# Patient Record
Sex: Female | Born: 1975 | Race: White | Hispanic: No | Marital: Married | Smoking: Never smoker
Health system: Southern US, Community
[De-identification: ages and names within clinical notes are randomized; demographics above are authoritative.]

## PROBLEM LIST (undated history)

## (undated) DIAGNOSIS — Z789 Other specified health status: Secondary | ICD-10-CM

## (undated) HISTORY — PX: RECTAL SURGERY: SHX760

---

## 1995-02-17 HISTORY — PX: WISDOM TOOTH EXTRACTION: SHX21

## 2013-10-10 ENCOUNTER — Encounter: Payer: Self-pay | Admitting: Certified Nurse Midwife

## 2013-10-10 ENCOUNTER — Ambulatory Visit (INDEPENDENT_AMBULATORY_CARE_PROVIDER_SITE_OTHER): Payer: Managed Care, Other (non HMO) | Admitting: Certified Nurse Midwife

## 2013-10-10 VITALS — BP 108/60 | HR 64 | Resp 16 | Ht 67.25 in | Wt 156.0 lb

## 2013-10-10 DIAGNOSIS — Z309 Encounter for contraceptive management, unspecified: Secondary | ICD-10-CM

## 2013-10-10 DIAGNOSIS — Z01419 Encounter for gynecological examination (general) (routine) without abnormal findings: Secondary | ICD-10-CM

## 2013-10-10 DIAGNOSIS — Z124 Encounter for screening for malignant neoplasm of cervix: Secondary | ICD-10-CM

## 2013-10-10 DIAGNOSIS — Z0000 Encounter for general adult medical examination without abnormal findings: Secondary | ICD-10-CM

## 2013-10-10 LAB — POCT URINALYSIS DIPSTICK
Bilirubin, UA: NEGATIVE
Blood, UA: NEGATIVE
GLUCOSE UA: NEGATIVE
Ketones, UA: NEGATIVE
Leukocytes, UA: NEGATIVE
Nitrite, UA: NEGATIVE
Protein, UA: NEGATIVE
Urobilinogen, UA: NEGATIVE
pH, UA: 5

## 2013-10-10 MED ORDER — NORGESTREL-ETHINYL ESTRADIOL 0.3-30 MG-MCG PO TABS: 1.0000 | ORAL_TABLET | Freq: Every day | ORAL | Status: DC

## 2013-10-10 NOTE — Progress Notes (Signed)
38 y.o. G0P0000 Married Caucasian Fe here to establish gyn care and for annual exam. Periods normal,no issues. OCP for contraception working well. Recently married 3 months ago. Recently moved here from [place]. Sees PCP prn. Plans to use Urgent care here. Plans to try to conceive after the first of the year. No other health concerns today.  Patient's last menstrual period was 09/12/2013.          Sexually active: Yes.    The current method of family planning is OCP (estrogen/progesterone).    Exercising: Yes.    walking Smoker:  no  Health Maintenance: Pap:  7/14  No abnormal pap smears MMG:  none Colonoscopy:  none BMD:   none TDaP:  2009 Labs: Poct urine-neg Self breast exam: not done   reports that she has never smoked. She does not have any smokeless tobacco history on file. She reports that she drinks about 2.5 ounces of alcohol per week. She reports that she does not use illicit drugs.  History reviewed. No pertinent past medical history.  Past Surgical History  Procedure Laterality Date  . Rectal surgery      as a child had repair due to injury    Current Outpatient Prescriptions  Medication Sig Dispense Refill  . CRYSELLE-28 0.3-30 MG-MCG tablet daily.       No current facility-administered medications for this visit.    Family History  Problem Relation Age of Onset  . Thyroid disease Mother   . Diabetes Father   . Heart disease Father     stints  . Emphysema Paternal Grandmother     ROS:  Pertinent items are noted in HPI.  Otherwise, a comprehensive ROS was negative.  Exam:   BP 108/60  Pulse 64  Resp 16  Ht 5' 7.25" (1.708 m)  Wt 156 lb (70.761 kg)  BMI 24.26 kg/m2  LMP 09/12/2013 Height: 5' 7.25" (170.8 cm)  Ht Readings from Last 3 Encounters:  10/10/13 5' 7.25" (1.708 m)    General appearance: alert, cooperative and appears stated age Head: Normocephalic, without obvious abnormality, atraumatic Neck: no adenopathy, supple, symmetrical,  trachea midline and thyroid normal to inspection and palpation Lungs: clear to auscultation bilaterally Breasts: normal appearance, no masses or tenderness, No nipple retraction or dimpling, No nipple discharge or bleeding, No axillary or supraclavicular adenopathy Heart: regular rate and rhythm Abdomen: soft, non-tender; no masses,  no organomegaly Extremities: extremities normal, atraumatic, no cyanosis or edema Skin: Skin color, texture, turgor normal. No rashes or lesions Lymph nodes: Cervical, supraclavicular, and axillary nodes normal. No abnormal inguinal nodes palpated Neurologic: Grossly normal   Pelvic: External genitalia:  no lesions              Urethra:  normal appearing urethra with no masses, tenderness or lesions              Bartholin's and Skene's: normal                 Vagina: normal appearing vagina with normal color and discharge, no lesions              Cervix: normal, non tender, no lesions              Pap taken: Yes.   Bimanual Exam:  Uterus:  normal size, contour, position, consistency, mobility, non-tender and anteverted              Adnexa: normal adnexa and no mass, fullness, tenderness  Rectovaginal: Confirms               Anus:  normal sphincter tone, no lesions  A:  Well Woman with normal exam  Contraception OCP desired  Preconceptual information desired  P:   Reviewed health and wellness pertinent to exam  Rx Cryselle see order  Pap smear taken today with [facility]  Given information regarding preconceptual planning and handout. Encouraged to schedule preconceptual consultation. Patient plans to.  Labs: Rubella, ABO RH   counseled on breast self exam, use and side effects of OCP's, adequate intake of calcium and vitamin D, diet and exercise  return annually or prn  An After Visit Summary was printed and given to the patient.

## 2013-10-10 NOTE — Patient Instructions (Addendum)
EXERCISE AND DIET:  We recommended that you start or continue a regular exercise program for good health. Regular exercise means any activity that makes your heart beat faster and makes you sweat.  We recommend exercising at least 30 minutes per day at least 3 days a week, preferably 4 or 5.  We also recommend a diet low in fat and sugar.  Inactivity, poor dietary choices and obesity can cause diabetes, heart attack, stroke, and kidney damage, among others.    ALCOHOL AND SMOKING:  Women should limit their alcohol intake to no more than 7 drinks/beers/glasses of wine (combined, not each!) per week. Moderation of alcohol intake to this level decreases your risk of breast cancer and liver damage. And of course, no recreational drugs are part of a healthy lifestyle.  And absolutely no smoking or even second hand smoke. Most people know smoking can cause heart and lung diseases, but did you know it also contributes to weakening of your bones? Aging of your skin?  Yellowing of your teeth and nails?  CALCIUM AND VITAMIN D:  Adequate intake of calcium and Vitamin D are recommended.  The recommendations for exact amounts of these supplements seem to change often, but generally speaking 600 mg of calcium (either carbonate or citrate) and 800 units of Vitamin D per day seems prudent. Certain women may benefit from higher intake of Vitamin D.  If you are among these women, your doctor will have told you during your visit.    PAP SMEARS:  Pap smears, to check for cervical cancer or precancers,  have traditionally been done yearly, although recent scientific advances have shown that most women can have pap smears less often.  However, every woman still should have a physical exam from her gynecologist every year. It will include a breast check, inspection of the vulva and vagina to check for abnormal growths or skin changes, a visual exam of the cervix, and then an exam to evaluate the size and shape of the uterus and  ovaries.  And after 40 years of age, a rectal exam is indicated to check for rectal cancers. We will also provide age appropriate advice regarding health maintenance, like when you should have certain vaccines, screening for sexually transmitted diseases, bone density testing, colonoscopy, mammograms, etc.   MAMMOGRAMS:  All women over 40 years old should have a yearly mammogram. Many facilities now offer a "3D" mammogram, which may cost around $50 extra out of pocket. If possible,  we recommend you accept the option to have the 3D mammogram performed.  It both reduces the number of women who will be called back for extra views which then turn out to be normal, and it is better than the routine mammogram at detecting truly abnormal areas.    COLONOSCOPY:  Colonoscopy to screen for colon cancer is recommended for all women at age 50.  We know, you hate the idea of the prep.  We agree, BUT, having colon cancer and not knowing it is worse!!  Colon cancer so often starts as a polyp that can be seen and removed at colonscopy, which can quite literally save your life!  And if your first colonoscopy is normal and you have no family history of colon cancer, most women don't have to have it again for 10 years.  Once every ten years, you can do something that may end up saving your life, right?  We will be happy to help you get it scheduled when you are ready.    Be sure to check your insurance coverage so you understand how much it will cost.  It may be covered as a preventative service at no cost, but you should check your particular policy.     Preparing for Pregnancy Before trying to become pregnant, make an appointment with your health care provider (preconception care). The goal is to help you have a healthy, safe pregnancy. At your first appointment, your health care provider will:   Do a complete physical exam, including a Pap test.  Take a complete medical history.  Give you advice and help you resolve  any problems. PRECONCEPTION CHECKLIST Here is a list of the basics to cover with your health care provider at your preconception visit:  Medical history.  Tell your health care provider about any diseases you have had. Many diseases can affect your pregnancy.  Include your partner's medical history and family history.  Make sure you have been tested for sexually transmitted infections (STIs). These can affect your pregnancy. In some cases, they can be passed to your baby. Tell your health care provider about any history of STIs.  Make sure your health care provider knows about any previous problems you have had with conception or pregnancy.  Tell your health care provider about any medicine you take. This includes herbal supplements and over-the-counter medicines.  Make sure all your immunizations are up to date. You may need to make additional appointments.  Ask your health care provider if you need any vaccinations or if there are any you should avoid.  Diet.  It is especially important to eat a healthy, balanced diet with the right nutrients when you are pregnant.  Ask your health care provider to help you get to a healthy weight before pregnancy.  If you are overweight, you are at higher risk for certain complications. These include high blood pressure, diabetes, and preterm birth.  If you are underweight, you are more likely to have a low-birth-weight baby.  Lifestyle.  Tell your health care provider about lifestyle factors such as alcohol use, drug use, or smoking.  Describe any harmful substances you may be exposed to at work or home. These can include chemicals, pesticides, and radiation.  Mental health.  Let your health care provider know if you have been feeling depressed or anxious.  Let your health care provider know if you have a history of substance abuse.  Let your health care provider know if you do not feel safe at home. HOME INSTRUCTIONS TO PREPARE FOR  PREGNANCY Follow your health care provider's advice and instructions.   Keep an accurate record of your menstrual periods. This makes it easier for your health care provider to determine your baby's due date.  Begin taking prenatal vitamins and folic acid supplements daily. Take them as directed by your health care provider.  Eat a balanced diet. Get help from a nutrition counselor if you have questions or need help.  Get regular exercise. Try to be active for at least 30 minutes a day most days of the week.  Quit smoking, if you smoke.  Do not drink alcohol.  Do not take illegal drugs.  Get medical problems, such as diabetes or high blood pressure, under control.  If you have diabetes, make sure you do the following:  Have good blood sugar control. If you have type 1 diabetes, use multiple daily doses of insulin. Do not use split-dose or premixed insulin.  Have an eye exam by a qualified eye care professional trained  in caring for people with diabetes.  Get evaluated by your health care provider for cardiovascular disease.  Get to a healthy weight. If you are overweight or obese, reduce your weight with the help of a qualified health professional such as a Museum/gallery exhibitions officer. Ask your health care provider what the right weight range is for you. HOW DO I KNOW I AM PREGNANT? You may be pregnant if you have been sexually active and you miss your period. Symptoms of early pregnancy include:   Mild cramping.  Very light vaginal bleeding (spotting).  Feeling unusually tired.  Morning sickness. If you have any of these symptoms, take a home pregnancy test. These tests look for a hormone called human chorionic gonadotropin (hCG) in your urine. Your body begins to make this hormone during early pregnancy. These tests are very accurate. Wait until at least the first day you miss your period to take one. If you get a positive result, call your health care provider to make appointments  for prenatal care. WHAT SHOULD I DO IF I BECOME PREGNANT?  Make an appointment with your health care provider by week 12 of your pregnancy at the latest.  Do not smoke. Smoking can be harmful to your baby.  Do not drink alcoholic beverages. Alcohol is related to a number of birth defects.  Avoid toxic odors and chemicals.  You may continue to have sexual intercourse if it does not cause pain or other problems, such as vaginal bleeding. Document Released: 01/16/2008 Document Revised: 06/19/2013 Document Reviewed: 01/09/2013 [facility] Patient Information 2015 [place], [place]. This information is not intended to replace advice given to you by your health care provider. Make sure you discuss any questions you have with your health care provider.

## 2013-10-11 LAB — ABO AND RH: Rh Type: POSITIVE

## 2013-10-11 LAB — RUBELLA SCREEN: RUBELLA: 1.74 {index} — AB (ref <0.90)

## 2013-10-12 LAB — IPS PAP TEST WITH HPV

## 2013-10-13 NOTE — Progress Notes (Signed)
Reviewed personally.  M. Suzanne Terilyn Sano, MD.  

## 2014-03-05 ENCOUNTER — Telehealth: Payer: Self-pay | Admitting: Certified Nurse Midwife

## 2014-03-05 NOTE — Telephone Encounter (Signed)
Spoke with patient. Patient states that her LMP was 12/11. Patient took 3 UPT on 1/15 that were all positive. Advised patient will need OV with Verner Choleborah S. Leonard CNM to confirm pregnancy and to discuss pregnancy. Patient is agreeable. Appointment scheduled for tomorrow at 10:30am. Agreeable to date and time. 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 Is this something that I will need to stop? Can I go tonight?" Advised patient can still go to crossfit class tonight. Cautioned not to perform any activity that would cause harm to herself or baby. Advised to speak with Verner Choleborah S. Leonard CNM tomorrow about how to modify work outs and when she will need to stop if needed. Patient is agreeable.  Routing to provider for final review. Patient agreeable to disposition. Will close encounter

## 2014-03-05 NOTE — Telephone Encounter (Signed)
Left message to call Corryn Madewell at [phone]. 

## 2014-03-05 NOTE — Telephone Encounter (Signed)
Patient took a pregnancy test it was positive she would like to talk with the nurse to discuss what she needs to do next.

## 2014-03-06 ENCOUNTER — Ambulatory Visit (INDEPENDENT_AMBULATORY_CARE_PROVIDER_SITE_OTHER): Payer: Managed Care, Other (non HMO) | Admitting: Certified Nurse Midwife

## 2014-03-06 ENCOUNTER — Encounter: Payer: Self-pay | Admitting: Certified Nurse Midwife

## 2014-03-06 VITALS — BP 104/66 | HR 72 | Resp 14 | Ht 67.25 in | Wt 163.8 lb

## 2014-03-06 DIAGNOSIS — Z3201 Encounter for pregnancy test, result positive: Secondary | ICD-10-CM

## 2014-03-06 DIAGNOSIS — N912 Amenorrhea, unspecified: Secondary | ICD-10-CM

## 2014-03-06 LAB — POCT URINE PREGNANCY: Preg Test, Ur: NEGATIVE

## 2014-03-06 NOTE — Patient Instructions (Signed)
Prenatal Care  WHAT IS PRENATAL CARE?  Prenatal care means health care during your pregnancy, before your baby is born. It is very important to take care of yourself and your baby during your pregnancy by:   Getting early prenatal care. If you know you are pregnant, or think you might be pregnant, call your health care provider as soon as possible. Schedule a visit for a prenatal exam.  Getting regular prenatal care. Follow your health care provider's schedule for blood and other necessary tests. Do not miss appointments.  Doing everything you can to keep yourself and your baby healthy during your pregnancy.  Getting complete care. Prenatal care should include evaluation of the medical, dietary, educational, psychological, and social needs of you and your significant other. The medical and genetic history of your family and the family of your baby's father should be discussed with your health care provider.  Discussing with your health care provider:  Prescription, over-the-counter, and herbal medicines that you take.  Any history of substance abuse, alcohol use, smoking, and illegal drug use.  Any history of domestic abuse and violence.  Immunizations you have received.  Your nutrition and diet.  The amount of exercise you do.  Any environmental and occupational hazards to which you are exposed.  History of sexually transmitted infections for both you and your partner.  Previous pregnancies you have had. WHY IS PRENATAL CARE SO IMPORTANT?  By regularly seeing your health care provider, you help ensure that problems can be identified early so that they can be treated as soon as possible. Other problems might be prevented. Many studies have shown that early and regular prenatal care is important for the health of mothers and their babies.  HOW CAN I TAKE CARE OF MYSELF WHILE I AM PREGNANT?  Here are ways to take care of yourself and your baby:   Start or continue taking your  multivitamin with 400 micrograms (mcg) of folic acid every day.  Get early and regular prenatal care. It is very important to see a health care provider during your pregnancy. Your health care provider will check at each visit to make sure that you and your baby are healthy. If there are any problems, action can be taken right away to help you and your baby.  Eat a healthy diet that includes:  Fruits.  Vegetables.  Foods low in saturated fat.  Whole grains.  Calcium-rich foods, such as milk, yogurt, and hard cheeses.  Drink 6-8 glasses of liquids a day.  Unless your health care provider tells you not to, try to be physically active for 30 minutes, most days of the week. If you are pressed for time, you can get your activity in through 10-minute segments, three times a day.  Do not smoke, drink alcohol, or use drugs. These can cause long-term damage to your baby. Talk with your health care provider about steps to take to stop smoking. Talk with a member of your faith community, a counselor, a trusted friend, or your health care provider if you are concerned about your alcohol or drug use.  Ask your health care provider before taking any medicine, even over-the-counter medicines. Some medicines are not safe to take during pregnancy.  Get plenty of rest and sleep.  Avoid hot tubs and saunas during pregnancy.  Do not have X-rays taken unless absolutely necessary and with the recommendation of your health care provider. A lead shield can be placed on your abdomen to protect your baby when   X-rays are taken in other parts of your body.  Do not empty the cat litter when you are pregnant. It may contain a parasite that causes an infection called toxoplasmosis, which can cause birth defects. Also, use gloves when working in garden areas used by cats.  Do not eat uncooked or undercooked meats or fish.  Do not eat soft, mold-ripened cheeses (Brie, Camembert, and chevre) or soft, blue-veined  cheese (Danish blue and Roquefort).  Stay away from toxic chemicals like:  Insecticides.  Solvents (some cleaners or paint thinners).  Lead.  Mercury.  Sexual intercourse may continue until the end of the pregnancy, unless you have a medical problem or there is a problem with the pregnancy and your health care provider tells you not to.  Do not wear high-heel shoes, especially during the second half of the pregnancy. You can lose your balance and fall.  Do not take long trips, unless absolutely necessary. Be sure to see your health care provider before going on the trip.  Do not sit in one position for more than 2 hours when on a trip.  Take a copy of your medical records when going on a trip. Know where a hospital is located in the city you are visiting, in case of an emergency.  Most dangerous household products will have pregnancy warnings on their labels. Ask your health care provider about products if you are unsure.  Limit or eliminate your caffeine intake from coffee, tea, sodas, medicines, and chocolate.  Many women continue working through pregnancy. Staying active might help you stay healthier. If you have a question about the safety or the hours you work at your particular job, talk with your health care provider.  Get informed:  Read books.  Watch videos.  Go to childbirth classes for you and your significant other.  Talk with experienced moms.  Ask your health care provider about childbirth education classes for you and your partner. Classes can help you and your partner prepare for the birth of your baby.  Ask about a baby doctor (pediatrician) and methods and pain medicine for labor, delivery, and possible cesarean delivery. HOW OFTEN SHOULD I SEE MY HEALTH CARE PROVIDER DURING PREGNANCY?  Your health care provider will give you a schedule for your prenatal visits. You will have visits more often as you get closer to the end of your pregnancy. An average  pregnancy lasts about 40 weeks.  A typical schedule includes visiting your health care provider:   About once each month during your first 6 months of pregnancy.  Every 2 weeks during the next 2 months.  Weekly in the last month, until the delivery date. Your health care provider will probably want to see you more often if:  You are older than 35 years.  Your pregnancy is high risk because you have certain health problems or problems with the pregnancy, such as:  Diabetes.  High blood pressure.  The baby is not growing on schedule, according to the dates of the pregnancy. Your health care provider will do special tests to make sure you and your baby are not having any serious problems. WHAT HAPPENS DURING PRENATAL VISITS?   At your first prenatal visit, your health care provider will do a physical exam and talk to you about your health history and the health history of your partner and your family. Your health care provider will be able to tell you what date to expect your baby to be born on.  Your   first physical exam will include checks of your blood pressure, measurements of your height and weight, and an exam of your pelvic organs. Your health care provider will do a Pap test if you have not had one recently and will do cultures of your cervix to make sure there is no infection.  At each prenatal visit, there will be tests of your blood, urine, blood pressure, weight, and the progress of the baby will be checked.  At your later prenatal visits, your health care provider will check how you are doing and how your baby is developing. You may have a number of tests done as your pregnancy progresses.  Ultrasound exams are often used to check on your baby's growth and health.  You may have more urine and blood tests, as well as special tests, if needed. These may include amniocentesis to examine fluid in the pregnancy sac, stress tests to check how the baby responds to contractions, or a  biophysical profile to measure your baby's well-being. Your health care provider will explain the tests and why they are necessary.  You should be tested for high blood sugar (gestational diabetes) between the 24th and 28th weeks of your pregnancy.  You should discuss with your health care provider your plans to breastfeed or bottle-feed your baby.  Each visit is also a chance for you to learn about staying healthy during pregnancy and to ask questions. Document Released: 02/05/2003 Document Revised: 02/07/2013 Document Reviewed: 04/19/2013 [employer] Patient Information 2015 [employer]. This information is not intended to replace advice given to you by your health care provider. Make sure you discuss any questions you have with your health care provider.  

## 2014-03-06 NOTE — Progress Notes (Signed)
Reviewed personally.  M. Suzanne Marchetta Navratil, MD.  

## 2014-03-06 NOTE — Progress Notes (Signed)
39 y.o. Married Caucasian G1P0000 here with complaint of amenorrhea with positive UPT 02/23/13. LNMP 01-26-14. Periods have been normal. Stopped OCP in 10/15 and used withdrawal for contraception until December and then stopped. Patient had two home positive UPT on  02/23/14. Patient has slight nausea with slight breast tenderness. Eating well and drinking water daily with limited coffee and soda. Normally does cross fit but feels this is two vigorous for her now and plans to decrease to treadmill and light had weights. Spouse excited also. Patient denies any bleeding spotting or cramping. She has started on OTC prenatal vitamins without problems. She lives in [place] and will probably obtain OB care there. [facility]. No other medication use, no alcohol or smoking.   O: Healthy female, WD WN Affect: normal orientation X 3    A:Amenorrhea with positive UPT here 5 wk 5 d per LNMP with EDC 11-03-14.  P: Discussed with patient nutritional importance in pregnancy. Discussed food selections and foods she should avoid or limit during pregnancy. Discussed no alcohol recommended in pregnancy. TDAP up to date and patient will have spouse up date his if needed. Discussed normal physiological changes during early pregnancy. Questions addressed. Discussed options for prenatal care and importance of scheduling appointment by 8-9 weeks. Patient plans to Internet review of providers in [place] and decide. Offered viability PUS here. Patient declines at this time, will advise after discussion with spouse if she desires to have here.Will advise when OB appointment scheduled. Reviewed warning signs of early pregnancy and did to call if occurs. Discussed comfort measures for nausea and bowel changes when occurring. Encouraged small frequent meals to help with nausea. Should advise if vomiting occurs frequently. Patient give information pamphlet on early pregnancy expectations and genetic  screening information. Questions addressed. Wished well with pregnancy.  30 minutes spent with patient with  in face to face counseling regarding pregnancy, prenatal care and expectations.Marland Kitchen.  RV

## 2014-04-03 LAB — OB RESULTS CONSOLE HEPATITIS B SURFACE ANTIGEN: Hepatitis B Surface Ag: NEGATIVE

## 2014-04-03 LAB — OB RESULTS CONSOLE HIV ANTIBODY (ROUTINE TESTING): HIV: NONREACTIVE

## 2014-04-03 LAB — OB RESULTS CONSOLE RUBELLA ANTIBODY, IGM: Rubella: IMMUNE

## 2014-04-03 LAB — OB RESULTS CONSOLE ABO/RH: RH Type: POSITIVE

## 2014-04-03 LAB — OB RESULTS CONSOLE ANTIBODY SCREEN: Antibody Screen: NEGATIVE

## 2014-04-03 LAB — OB RESULTS CONSOLE RPR: RPR: NONREACTIVE

## 2014-04-12 LAB — OB RESULTS CONSOLE GC/CHLAMYDIA
Chlamydia: NEGATIVE
GC PROBE AMP, GENITAL: NEGATIVE

## 2014-10-16 LAB — OB RESULTS CONSOLE GBS: GBS: POSITIVE

## 2014-10-23 ENCOUNTER — Ambulatory Visit: Payer: Managed Care, Other (non HMO) | Admitting: Certified Nurse Midwife

## 2014-10-28 ENCOUNTER — Inpatient Hospital Stay (HOSPITAL_COMMUNITY)
Admission: AD | Admit: 2014-10-28 | Discharge: 2014-10-28 | Disposition: A | Discharge status: Home / Self Care | Payer: Managed Care, Other (non HMO) | Source: Ambulatory Visit | Attending: Obstetrics and Gynecology | Admitting: Obstetrics and Gynecology

## 2014-10-28 ENCOUNTER — Encounter (HOSPITAL_COMMUNITY): Payer: Self-pay | Admitting: *Deleted

## 2014-10-28 DIAGNOSIS — Z3493 Encounter for supervision of normal pregnancy, unspecified, third trimester: Secondary | ICD-10-CM | POA: Insufficient documentation

## 2014-10-28 LAB — AMNISURE RUPTURE OF MEMBRANE (ROM) NOT AT ARMC: AMNISURE: NEGATIVE

## 2014-10-28 NOTE — Discharge Instructions (Signed)
Braxton Hicks Contractions °Contractions of the uterus can occur throughout pregnancy. Contractions are not always a sign that you are in labor.  °WHAT ARE BRAXTON HICKS CONTRACTIONS?  °Contractions that occur before labor are called Braxton Hicks contractions, or false labor. Toward the end of pregnancy (32-34 weeks), these contractions can develop more often and may become more forceful. This is not true labor because these contractions do not result in opening (dilatation) and thinning of the cervix. They are sometimes difficult to tell apart from true labor because these contractions can be forceful and people have different pain tolerances. You should not feel embarrassed if you go to the hospital with false labor. Sometimes, the only way to tell if you are in true labor is for your health care provider to look for changes in the cervix. °If there are no prenatal problems or other health problems associated with the pregnancy, it is completely safe to be sent home with false labor and await the onset of true labor. °HOW CAN YOU TELL THE DIFFERENCE BETWEEN TRUE AND FALSE LABOR? °False Labor °· The contractions of false labor are usually shorter and not as hard as those of true labor.   °· The contractions are usually irregular.   °· The contractions are often felt in the front of the lower abdomen and in the groin.   °· The contractions may go away when you walk around or change positions while lying down.   °· The contractions get weaker and are shorter lasting as time goes on.   °· The contractions do not usually become progressively stronger, regular, and closer together as with true labor.   °True Labor °· Contractions in true labor last 30-70 seconds, become very regular, usually become more intense, and increase in frequency.   °· The contractions do not go away with walking.   °· The discomfort is usually felt in the top of the uterus and spreads to the lower abdomen and low back.   °· True labor can be  determined by your health care provider with an exam. This will show that the cervix is dilating and getting thinner.   °WHAT TO REMEMBER °· Keep up with your usual exercises and follow other instructions given by your health care provider.   °· Take medicines as directed by your health care provider.   °· Keep your regular prenatal appointments.   °· Eat and drink lightly if you think you are going into labor.   °· If Braxton Hicks contractions are making you uncomfortable:   °¨ Change your position from lying down or resting to walking, or from walking to resting.   °¨ Sit and rest in a tub of warm water.   °¨ Drink 2-3 glasses of water. Dehydration may cause these contractions.   °¨ Do slow and deep breathing several times an hour.   °WHEN SHOULD I SEEK IMMEDIATE MEDICAL CARE? °Seek immediate medical care if: °· Your contractions become stronger, more regular, and closer together.   °· You have fluid leaking or gushing from your vagina.   °· You have a fever.   °· You pass blood-tinged mucus.   °· You have vaginal bleeding.   °· You have continuous abdominal pain.   °· You have low back pain that you never had before.   °· You feel your baby's head pushing down and causing pelvic pressure.   °· Your baby is not moving as much as it used to.   °Document Released: 02/02/2005 Document Revised: 02/07/2013 Document Reviewed: 11/14/2012 °Exit[employer]® Patient Information ©2015 [employer]. This information is not intended to replace advice given to you by your health care   provider. Make sure you discuss any questions you have with your health care provider. ° °

## 2014-10-28 NOTE — MAU Note (Signed)
Gush of fluid while brushing her teeth, small amt since. No bleeding.  No pains, some pelvic pressure.  No problems with preg.

## 2014-10-28 NOTE — MAU Note (Signed)
Per HMitchell, RN charge, pt to go to room 169.  

## 2014-11-07 ENCOUNTER — Encounter (HOSPITAL_COMMUNITY): Payer: Self-pay

## 2014-11-07 ENCOUNTER — Inpatient Hospital Stay (HOSPITAL_COMMUNITY): Payer: Managed Care, Other (non HMO) | Admitting: Anesthesiology

## 2014-11-07 ENCOUNTER — Inpatient Hospital Stay (HOSPITAL_COMMUNITY)
Admission: AD | Admit: 2014-11-07 | Discharge: 2014-11-10 | DRG: 775 | Disposition: A | Discharge status: Home / Self Care | Payer: Managed Care, Other (non HMO) | Source: Ambulatory Visit | Attending: Obstetrics and Gynecology | Admitting: Obstetrics and Gynecology

## 2014-11-07 DIAGNOSIS — Z833 Family history of diabetes mellitus: Secondary | ICD-10-CM | POA: Diagnosis not present

## 2014-11-07 DIAGNOSIS — Z825 Family history of asthma and other chronic lower respiratory diseases: Secondary | ICD-10-CM | POA: Diagnosis not present

## 2014-11-07 DIAGNOSIS — Z3A39 39 weeks gestation of pregnancy: Secondary | ICD-10-CM | POA: Diagnosis present

## 2014-11-07 DIAGNOSIS — O99824 Streptococcus B carrier state complicating childbirth: Secondary | ICD-10-CM | POA: Diagnosis present

## 2014-11-07 DIAGNOSIS — Z8249 Family history of ischemic heart disease and other diseases of the circulatory system: Secondary | ICD-10-CM | POA: Diagnosis not present

## 2014-11-07 HISTORY — DX: Other specified health status: Z78.9

## 2014-11-07 LAB — CBC
HCT: 37.4 % (ref 36.0–46.0)
HEMOGLOBIN: 13.3 g/dL (ref 12.0–15.0)
MCH: 32.6 pg (ref 26.0–34.0)
MCHC: 35.6 g/dL (ref 30.0–36.0)
MCV: 91.7 fL (ref 78.0–100.0)
Platelets: 188 10*3/uL (ref 150–400)
RBC: 4.08 MIL/uL (ref 3.87–5.11)
RDW: 12.8 % (ref 11.5–15.5)
WBC: 10.6 10*3/uL — ABNORMAL HIGH (ref 4.0–10.5)

## 2014-11-07 LAB — TYPE AND SCREEN
ABO/RH(D): A POS
ANTIBODY SCREEN: NEGATIVE

## 2014-11-07 MED ORDER — ACETAMINOPHEN 325 MG PO TABS: 650.0000 mg | ORAL_TABLET | ORAL | Status: DC | PRN

## 2014-11-07 MED ORDER — LACTATED RINGERS IV SOLN
INTRAVENOUS | Status: DC
  Administered 2014-11-07 (×2): via INTRAVENOUS

## 2014-11-07 MED ORDER — ONDANSETRON HCL 4 MG/2ML IJ SOLN
4.0000 mg | Freq: Four times a day (QID) | INTRAMUSCULAR | Status: DC | PRN
  Administered 2014-11-07: 4 mg via INTRAVENOUS
  Filled 2014-11-07: qty 2

## 2014-11-07 MED ORDER — LIDOCAINE HCL (PF) 1 % IJ SOLN
INTRAMUSCULAR | Status: DC | PRN
  Administered 2014-11-07 (×2): 5 mL
  Administered 2014-11-07: 3 mL

## 2014-11-07 MED ORDER — LIDOCAINE HCL (PF) 1 % IJ SOLN
30.0000 mL | INTRAMUSCULAR | Status: DC | PRN
  Filled 2014-11-07: qty 30

## 2014-11-07 MED ORDER — DIPHENHYDRAMINE HCL 50 MG/ML IJ SOLN: 12.5000 mg | INTRAMUSCULAR | Status: DC | PRN

## 2014-11-07 MED ORDER — PENICILLIN G POTASSIUM 5000000 UNITS IJ SOLR
2.5000 10*6.[IU] | INTRAVENOUS | Status: DC
  Administered 2014-11-08: 2.5 10*6.[IU] via INTRAVENOUS
  Filled 2014-11-07 (×5): qty 2.5

## 2014-11-07 MED ORDER — FENTANYL 2.5 MCG/ML BUPIVACAINE 1/10 % EPIDURAL INFUSION (WH - ANES)
14.0000 mL/h | INTRAMUSCULAR | Status: DC | PRN
  Administered 2014-11-07: 14 mL/h via EPIDURAL
  Filled 2014-11-07: qty 125

## 2014-11-07 MED ORDER — LACTATED RINGERS IV SOLN
500.0000 mL | INTRAVENOUS | Status: DC | PRN
  Administered 2014-11-07 (×2): 500 mL via INTRAVENOUS

## 2014-11-07 MED ORDER — OXYTOCIN 40 UNITS IN LACTATED RINGERS INFUSION - SIMPLE MED
62.5000 mL/h | INTRAVENOUS | Status: DC
  Administered 2014-11-08: 62.5 mL/h via INTRAVENOUS
  Filled 2014-11-07: qty 1000

## 2014-11-07 MED ORDER — PHENYLEPHRINE 40 MCG/ML (10ML) SYRINGE FOR IV PUSH (FOR BLOOD PRESSURE SUPPORT)
80.0000 ug | PREFILLED_SYRINGE | INTRAVENOUS | Status: DC | PRN
  Administered 2014-11-07: 80 ug via INTRAVENOUS
  Filled 2014-11-07: qty 20
  Filled 2014-11-07: qty 2

## 2014-11-07 MED ORDER — PENICILLIN G POTASSIUM 5000000 UNITS IJ SOLR
5.0000 10*6.[IU] | Freq: Once | INTRAVENOUS | Status: AC
  Administered 2014-11-07: 5 10*6.[IU] via INTRAVENOUS
  Filled 2014-11-07: qty 5

## 2014-11-07 MED ORDER — OXYTOCIN BOLUS FROM INFUSION
500.0000 mL | INTRAVENOUS | Status: DC
Start: 2014-11-07 — End: 2014-11-08
  Administered 2014-11-08: 500 mL via INTRAVENOUS

## 2014-11-07 MED ORDER — CITRIC ACID-SODIUM CITRATE 334-500 MG/5ML PO SOLN: 30.0000 mL | ORAL | Status: DC | PRN

## 2014-11-07 MED ORDER — OXYCODONE-ACETAMINOPHEN 5-325 MG PO TABS: 1.0000 | ORAL_TABLET | ORAL | Status: DC | PRN

## 2014-11-07 MED ORDER — OXYCODONE-ACETAMINOPHEN 5-325 MG PO TABS: 2.0000 | ORAL_TABLET | ORAL | Status: DC | PRN

## 2014-11-07 MED ORDER — EPHEDRINE 5 MG/ML INJ
10.0000 mg | INTRAVENOUS | Status: DC | PRN
  Filled 2014-11-07: qty 2

## 2014-11-07 NOTE — Anesthesia Procedure Notes (Signed)
Epidural Patient location during procedure: OB  Staffing Anesthesiologist: Vetra Shinall Performed by: anesthesiologist   Preanesthetic Checklist Completed: patient identified, site marked, surgical consent, pre-op evaluation, timeout performed, IV checked, risks and benefits discussed and monitors and equipment checked  Epidural Patient position: sitting Prep: DuraPrep Patient monitoring: heart rate, continuous pulse ox and blood pressure Approach: right paramedian Location: L3-L4 Injection technique: LOR saline  Needle:  Needle type: Tuohy  Needle gauge: 17 G Needle length: 9 cm and 9 Needle insertion depth: 5 cm Catheter type: closed end flexible Catheter size: 20 Guage Catheter at skin depth: 10 cm Test dose: negative  Assessment Events: blood not aspirated, injection not painful, no injection resistance, negative IV test and no paresthesia  Additional Notes Patient identified. Risks/Benefits/Options discussed with patient including but not limited to bleeding, infection, nerve damage, paralysis, failed block, incomplete pain control, headache, blood pressure changes, nausea, vomiting, reactions to medication both or allergic, itching and postpartum back pain. Confirmed with bedside nurse the patient's most recent platelet count. Confirmed with patient that they are not currently taking any anticoagulation, have any bleeding history or any family history of bleeding disorders. Patient expressed understanding and wished to proceed. All questions were answered. Sterile technique was used throughout the entire procedure. Please see nursing notes for vital signs. Test dose was given through epidural needle and negative prior to continuing to dose epidural or start infusion. Warning signs of high block given to the patient including shortness of breath, tingling/numbness in hands, complete motor block, or any concerning symptoms with instructions to call for help. Patient was given  instructions on fall risk and not to get out of bed. All questions and concerns addressed with instructions to call with any issues.   

## 2014-11-07 NOTE — H&P (Signed)
Patricia Ortega is a 39 y.o. female G1 @ 39 wks presenting for labor.  Pregnancy uncomplicated. History OB History    Gravida Para Term Preterm AB TAB SAB Ectopic Multiple Living       History reviewed. No pertinent past medical history. Past Surgical History  Procedure Laterality Date  . Rectal surgery      as a child had repair due to injury  . Wisdom tooth extraction Bilateral 1997   Family History: family history includes Diabetes in her father; Emphysema in her paternal grandmother; Heart disease in her father; Thyroid disease in her mother. Social History:  reports that she has never smoked. She has never used smokeless tobacco. She reports that she drinks about 2.5 oz of alcohol per week. She reports that she does not use illicit drugs.   Prenatal Transfer Tool  Maternal Diabetes: No Genetic Screening: Declined Maternal Ultrasounds/Referrals: Normal Fetal Ultrasounds or other Referrals:  None Maternal Substance Abuse:  No Significant Maternal Medications:  None Significant Maternal Lab Results:  None Other Comments:  None  ROS  Dilation: 3 Effacement (%): 90 Station: -3 Exam by:: D Simpson RN Blood pressure 126/77, pulse 77, temperature 98 F (36.7 C), temperature source Oral, resp. rate 18, last menstrual period 01/26/2014, SpO2 99 %. Exam Physical Exam  Gen - uncomfortable w/ epidural Abd - gravid, NT Cvx 3cm per rn exam - 1cm in office  Prenatal labs: ABO, Rh: A/Positive/-- (02/16 0000) Antibody: Negative (02/16 0000) Rubella: Immune (02/16 0000) RPR: Nonreactive (02/16 0000)  HBsAg: Negative (02/16 0000)  HIV: Non-reactive (02/16 0000)  GBS: Positive (08/30 0000)   Assessment/Plan: Labor Admit PCN epidural   ADKINS,GRETCHEN 11/07/2014, 8:15 PM

## 2014-11-07 NOTE — MAU Note (Signed)
Contractions since 5pm-irregular. Denies LOF. Some bloody show. +FM. +GBS 1cm last week

## 2014-11-07 NOTE — Anesthesia Preprocedure Evaluation (Signed)

## 2014-11-08 ENCOUNTER — Encounter (HOSPITAL_COMMUNITY): Payer: Self-pay

## 2014-11-08 LAB — RPR: RPR: NONREACTIVE

## 2014-11-08 LAB — ABO/RH: ABO/RH(D): A POS

## 2014-11-08 MED ORDER — DIBUCAINE 1 % RE OINT: 1.0000 "application " | TOPICAL_OINTMENT | RECTAL | Status: DC | PRN

## 2014-11-08 MED ORDER — TETANUS-DIPHTH-ACELL PERTUSSIS 5-2.5-18.5 LF-MCG/0.5 IM SUSP
0.5000 mL | Freq: Once | INTRAMUSCULAR | Status: DC
Start: 2014-11-09 — End: 2014-11-10

## 2014-11-08 MED ORDER — ONDANSETRON HCL 4 MG PO TABS: 4.0000 mg | ORAL_TABLET | ORAL | Status: DC | PRN

## 2014-11-08 MED ORDER — MEDROXYPROGESTERONE ACETATE 150 MG/ML IM SUSP: 150.0000 mg | INTRAMUSCULAR | Status: DC | PRN

## 2014-11-08 MED ORDER — INFLUENZA VAC SPLIT QUAD 0.5 ML IM SUSY
0.5000 mL | PREFILLED_SYRINGE | INTRAMUSCULAR | Status: AC
  Administered 2014-11-09: 0.5 mL via INTRAMUSCULAR
  Filled 2014-11-08: qty 0.5

## 2014-11-08 MED ORDER — OXYCODONE-ACETAMINOPHEN 5-325 MG PO TABS
1.0000 | ORAL_TABLET | ORAL | Status: DC | PRN
  Administered 2014-11-09: 1 via ORAL
  Filled 2014-11-08: qty 1

## 2014-11-08 MED ORDER — IBUPROFEN 600 MG PO TABS
600.0000 mg | ORAL_TABLET | Freq: Four times a day (QID) | ORAL | Status: DC
  Administered 2014-11-08 – 2014-11-10 (×10): 600 mg via ORAL
  Filled 2014-11-08 (×10): qty 1

## 2014-11-08 MED ORDER — ACETAMINOPHEN 325 MG PO TABS: 650.0000 mg | ORAL_TABLET | ORAL | Status: DC | PRN

## 2014-11-08 MED ORDER — SIMETHICONE 80 MG PO CHEW: 80.0000 mg | CHEWABLE_TABLET | ORAL | Status: DC | PRN

## 2014-11-08 MED ORDER — LANOLIN HYDROUS EX OINT: TOPICAL_OINTMENT | CUTANEOUS | Status: DC | PRN

## 2014-11-08 MED ORDER — OXYCODONE-ACETAMINOPHEN 5-325 MG PO TABS: 2.0000 | ORAL_TABLET | ORAL | Status: DC | PRN

## 2014-11-08 MED ORDER — BENZOCAINE-MENTHOL 20-0.5 % EX AERO
1.0000 "application " | INHALATION_SPRAY | CUTANEOUS | Status: DC | PRN
  Administered 2014-11-08: 1 via TOPICAL
  Filled 2014-11-08: qty 56

## 2014-11-08 MED ORDER — ZOLPIDEM TARTRATE 5 MG PO TABS: 5.0000 mg | ORAL_TABLET | Freq: Every evening | ORAL | Status: DC | PRN

## 2014-11-08 MED ORDER — WITCH HAZEL-GLYCERIN EX PADS: 1.0000 "application " | MEDICATED_PAD | CUTANEOUS | Status: DC | PRN

## 2014-11-08 MED ORDER — PRENATAL MULTIVITAMIN CH
1.0000 | ORAL_TABLET | Freq: Every day | ORAL | Status: DC
  Administered 2014-11-08 – 2014-11-10 (×3): 1 via ORAL
  Filled 2014-11-08 (×3): qty 1

## 2014-11-08 MED ORDER — MEASLES, MUMPS & RUBELLA VAC ~~LOC~~ INJ
0.5000 mL | INJECTION | Freq: Once | SUBCUTANEOUS | Status: DC
  Filled 2014-11-08: qty 0.5

## 2014-11-08 MED ORDER — DIPHENHYDRAMINE HCL 25 MG PO CAPS: 25.0000 mg | ORAL_CAPSULE | Freq: Four times a day (QID) | ORAL | Status: DC | PRN

## 2014-11-08 MED ORDER — ONDANSETRON HCL 4 MG/2ML IJ SOLN: 4.0000 mg | INTRAMUSCULAR | Status: DC | PRN

## 2014-11-08 MED ORDER — SENNOSIDES-DOCUSATE SODIUM 8.6-50 MG PO TABS
2.0000 | ORAL_TABLET | ORAL | Status: DC
  Administered 2014-11-08 – 2014-11-09 (×2): 2 via ORAL
  Filled 2014-11-08 (×2): qty 2

## 2014-11-08 NOTE — Progress Notes (Signed)
S:  Patient doing well. No complaints  O;  BP 120/74 mmHg  Pulse 79  Temp(Src) 98.2 F (36.8 C) (Oral)  Resp 18  Ht  (1.727 m)  Wt 88.451 kg (195 lb)  BMI 29.66 kg/m2  SpO2 100%  LMP 01/26/2014  Breastfeeding? Unknown Abdomen is soft and non tender  Results for orders placed or performed during the hospital encounter of 11/07/14 (from the past 24 hour(s))  CBC     Status: Abnormal   Collection Time: 11/07/14  8:30 PM  Result Value Ref Range   WBC 10.6 (H) 4.0 - 10.5 K/uL   RBC 4.08 3.87 - 5.11 MIL/uL   Hemoglobin 13.3 12.0 - 15.0 g/dL   HCT 40.9 81.1 - 91.4 %   MCV 91.7 78.0 - 100.0 fL   MCH 32.6 26.0 - 34.0 pg   MCHC 35.6 30.0 - 36.0 g/dL   RDW 78.2 95.6 - 21.3 %   Platelets 188 150 - 400 K/uL  Type and screen     Status: None   Collection Time: 11/07/14  8:30 PM  Result Value Ref Range   ABO/RH(D) A POS    Antibody Screen NEG    Sample Expiration 11/10/2014   RPR     Status: None   Collection Time: 11/07/14  8:30 PM  Result Value Ref Range   RPR Ser Ql Non Reactive Non Reactive  ABO/Rh     Status: None   Collection Time: 11/07/14  8:30 PM  Result Value Ref Range   ABO/RH(D) A POS    IMPRESSION: PPD # 0  Doing well Routine care

## 2014-11-08 NOTE — Progress Notes (Signed)
Delivery of live viable female by Dr. Renaldo Fiddler, APGARS 9,9

## 2014-11-08 NOTE — Anesthesia Postprocedure Evaluation (Signed)
  Anesthesia Post-op Note  Patient: Patricia Ortega, Patricia Ortega  Procedure(s) Performed: * No procedures listed *  Patient Location: Mother/Baby  Anesthesia Type:Epidural  Level of Consciousness: awake, alert , oriented and patient cooperative  Airway and Oxygen Therapy: Patient Spontanous Breathing  Post-op Pain: mild  Post-op Assessment: Patient's Cardiovascular Status Stable, Respiratory Function Stable, No headache, No backache and Patient able to bend at knees              Post-op Vital Signs: Reviewed and stable  Last Vitals:  Filed Vitals:   11/08/14 0625  BP: 120/74  Pulse: 79  Temp: 36.8 C  Resp: 18    Complications: No apparent anesthesia complications

## 2014-11-08 NOTE — Progress Notes (Signed)
SVD of vigorous female infant w/ apgars of 9,9.  Placenta delivered spontaneous w/ 3VC.   2nd degree lac repaired w/ 3-0 vicryl rapide.  Fundus firm.  EBL 250cc .

## 2014-11-08 NOTE — Lactation Note (Signed)
This note was copied from the chart of Patricia Greece. Lactation Consultation Note  Baby 15 hours old.  Reviewed hand expression.  Mother has short shaft nipples. Attempted latching in football but baby latched better in cross cradle. Sucks and swallows observed.  Taught mother to compress breast to increase depth and massage breast during feeding. Parents have had steady stream of visitors and mother states she has been unable to breastfeed. Explained to parents the importance of rest, STS and time to learn how to breastfeed. Discussed applying ebm for soreness, cluster feeding and basics. Mom made aware of O/P services, breastfeeding support groups, community resources, and our phone # for post-discharge questions.  Mom encouraged to feed baby 8-12 times/24 hours and with feeding cues.  Provided mother w/ shells and a hand pump.   Patient Name: Patricia Ortega Today's Date: 11/08/2014 Reason for consult: Initial assessment   Maternal Data    Feeding Feeding Type: Breast Fed Length of feed: 0 min  LATCH Score/Interventions Latch: Repeated attempts needed to sustain latch, nipple held in mouth throughout feeding, stimulation needed to elicit sucking reflex. Intervention(s): Adjust position;Assist with latch;Breast massage;Breast compression  Audible Swallowing: A few with stimulation Intervention(s): Skin to skin;Hand expression;Alternate breast massage  Type of Nipple: Everted at rest and after stimulation  Comfort (Breast/Nipple): Soft / non-tender     Hold (Positioning): Assistance needed to correctly position infant at breast and maintain latch.  LATCH Score: 7  Lactation Tools Discussed/Used     Consult Status Consult Status: Follow-up Date: 11/09/14 Follow-up type: In-patient    Patricia Ortega [facility] 11/08/2014, 7:34 PM

## 2014-11-09 LAB — CBC
HEMATOCRIT: 27.5 % — AB (ref 36.0–46.0)
HEMOGLOBIN: 9.6 g/dL — AB (ref 12.0–15.0)
MCH: 32.1 pg (ref 26.0–34.0)
MCHC: 34.9 g/dL (ref 30.0–36.0)
MCV: 92 fL (ref 78.0–100.0)
Platelets: 136 10*3/uL — ABNORMAL LOW (ref 150–400)
RBC: 2.99 MIL/uL — ABNORMAL LOW (ref 3.87–5.11)
RDW: 13.2 % (ref 11.5–15.5)
WBC: 11.1 10*3/uL — AB (ref 4.0–10.5)

## 2014-11-09 NOTE — Progress Notes (Signed)
Post Partum Day 1 Subjective: up ad lib, voiding, tolerating PO and + flatus  Objective: Blood pressure 120/84, pulse 63, temperature 98.5 F (36.9 C), temperature source Oral, resp. rate 18, height  (1.727 m), weight 195 lb (88.451 kg), last menstrual period 01/26/2014, SpO2 99 %, unknown if currently breastfeeding.  Physical Exam:  General: alert, cooperative and no distress Lochia: appropriate Uterine Fundus: firm Incision: healing well DVT Evaluation: No evidence of DVT seen on physical exam.   Recent Labs  11/07/14 2030 11/09/14 0530  HGB 13.3 9.6*  HCT 37.4 27.5*    Assessment/Plan: Plan for discharge tomorrow   LOS: 2 days   TOMBLIN II,JAMES E 11/09/2014, 9:10 AM

## 2014-11-09 NOTE — Lactation Note (Signed)
This note was copied from the chart of Boy Greece. Lactation Consultation Note Follow up visit at 37 hours of age.  Mom reports baby is gone for circumcision and has not been eating well.  Mom has hand pump and shells not using them yet.  Encouraged mom to hand express onto spoon and use hand pump every 2 hours until baby is feeding well at breast.  Baby arrived baby from circ.  Assisted with STS to breast in cross cradle hold on left breast, pre pumped.  Few attempts to get a good latch.  Initial latch on pain resolved with rhythmic sucking.  Good strong jaw excursions noted.  Mom aware of good deep latch and feeding cues.  Mom to offer STS until baby is feeding well as often as she can.  Cluster feeding discussed.  Mom to call for assist if baby is not eating frequently.     Patient Name: Boy [place] [id number] Date: 11/09/2014 Reason for consult: Follow-up assessment   Maternal Data Has patient been taught Hand Expression?: Yes Does the patient have breastfeeding experience prior to this delivery?: No  Feeding Feeding Type: Breast Fed Length of feed:  (10 minutes observed)  LATCH Score/Interventions Latch: Grasps breast easily, tongue down, lips flanged, rhythmical sucking. Intervention(s): Adjust position;Assist with latch;Breast massage;Breast compression  Audible Swallowing: A few with stimulation Intervention(s): Skin to skin;Hand expression Intervention(s): Alternate breast massage  Type of Nipple: Everted at rest and after stimulation (short shaft)  Comfort (Breast/Nipple): Soft / non-tender     Hold (Positioning): Assistance needed to correctly position infant at breast and maintain latch. Intervention(s): Breastfeeding basics reviewed;Support Pillows;Position options;Skin to skin  LATCH Score: 8  Lactation Tools Discussed/Used Date initiated:: 11/09/14   Consult Status Consult Status: Follow-up Date: 11/10/14 Follow-up type: In-patient    Jannifer Rodney 11/09/2014, 5:04 PM

## 2014-11-10 MED ORDER — OXYCODONE-ACETAMINOPHEN 5-325 MG PO TABS: 1.0000 | ORAL_TABLET | Freq: Four times a day (QID) | ORAL | Status: DC | PRN

## 2014-11-10 MED ORDER — IBUPROFEN 600 MG PO TABS: 600.0000 mg | ORAL_TABLET | Freq: Four times a day (QID) | ORAL | Status: DC | PRN

## 2014-11-10 NOTE — Lactation Note (Addendum)
This note was copied from the chart of Patricia [place]. Lactation Consultation Note  Patient Name: Patricia Ortega [id number] Date: 11/10/2014 Reason for consult: Follow-up assessment Baby 58 hr old and set for D/C today, WNL see flow sheets. Mom has bilateral bruised stripe across nipple face. She stated it was from the baby rolling his bottom lip in. She ws able to demonstrate how to pull the lip out and the jaw down. She demonstrated manual expression. She does have a DEBP at home. Discussed breast/nipple care, engorgement prevention/treatment. Given comfort gels, and checked flange size. She is aware of lactation service and will call as needed. She decline making an O/P apt today.   Maternal Data    Feeding Feeding Type: Breast Fed Length of feed: 20 min  LATCH Score/Interventions Latch: Grasps breast easily, tongue down, lips flanged, rhythmical sucking. Intervention(s): Adjust position  Audible Swallowing: A few with stimulation Intervention(s): Skin to skin Intervention(s): Hand expression  Type of Nipple: Everted at rest and after stimulation  Comfort (Breast/Nipple): Filling, red/small blisters or bruises, mild/mod discomfort  Problem noted: Mild/Moderate discomfort (Bruised nipples that are getting bettter) Interventions (Mild/moderate discomfort): Hand expression  Hold (Positioning): No assistance needed to correctly position infant at breast. Intervention(s): Support Pillows  LATCH Score: 8  Lactation Tools Discussed/Used     Consult Status      Patricia Ortega 11/10/2014, 1:04 PM

## 2014-11-10 NOTE — Discharge Summary (Signed)
Obstetric Discharge Summary Reason for Admission: onset of labor Prenatal Procedures: none Intrapartum Procedures: spontaneous vaginal delivery Postpartum Procedures: none Complications-Operative and Postpartum: none HEMOGLOBIN  Date Value Ref Range Status  11/09/2014 9.6* 12.0 - 15.0 g/dL Final    Comment:    REPEATED TO VERIFY DELTA CHECK NOTED    HCT  Date Value Ref Range Status  11/09/2014 27.5* 36.0 - 46.0 % Final    Physical Exam:  General: alert, cooperative and no distress Lochia: appropriate Uterine Fundus: firm Incision: healing well DVT Evaluation: No evidence of DVT seen on physical exam.  Discharge Diagnoses: Term Pregnancy-delivered  Discharge Information: Date: 11/10/2014 Activity: pelvic rest Diet: routine Medications: PNV, Ibuprofen and Percocet Condition: stable Instructions: refer to practice specific booklet Discharge to: home   Newborn Data: Live born female  Birth Weight: 7 lb 12 oz (3515 g) APGAR: 9, 9  Home with mother.  TOMBLIN II,JAMES E 11/10/2014, 8:51 PM

## 2014-11-10 NOTE — Progress Notes (Signed)
Post Partum Day 2 Subjective: no complaints, up ad lib, voiding, tolerating PO and + flatus  Objective: Blood pressure 112/56, pulse 66, temperature 98.4 F (36.9 C), temperature source Oral, resp. rate 17, height  (1.727 m), weight 195 lb (88.451 kg), last menstrual period 01/26/2014, SpO2 99 %, unknown if currently breastfeeding.  Physical Exam:  General: alert, cooperative and no distress Lochia: appropriate Uterine Fundus: firm Incision: healing well DVT Evaluation: No evidence of DVT seen on physical exam.   Recent Labs  11/07/14 2030 11/09/14 0530  HGB 13.3 9.6*  HCT 37.4 27.5*    Assessment/Plan: Plan for discharge tomorrow   LOS: 3 days   TOMBLIN II,JAMES E 11/10/2014, 9:10 AM

## 2019-02-27 ENCOUNTER — Emergency Department (HOSPITAL_COMMUNITY)
Admission: EM | Admit: 2019-02-27 | Discharge: 2019-02-27 | Disposition: A | Discharge status: Home / Self Care | Payer: Managed Care, Other (non HMO) | Attending: Emergency Medicine | Admitting: Emergency Medicine

## 2019-02-27 ENCOUNTER — Emergency Department (HOSPITAL_COMMUNITY): Payer: Managed Care, Other (non HMO)

## 2019-02-27 ENCOUNTER — Encounter (HOSPITAL_COMMUNITY): Payer: Self-pay | Admitting: Emergency Medicine

## 2019-02-27 DIAGNOSIS — Z20822 Contact with and (suspected) exposure to covid-19: Secondary | ICD-10-CM | POA: Insufficient documentation

## 2019-02-27 DIAGNOSIS — R200 Anesthesia of skin: Secondary | ICD-10-CM | POA: Diagnosis not present

## 2019-02-27 DIAGNOSIS — R42 Dizziness and giddiness: Secondary | ICD-10-CM | POA: Diagnosis not present

## 2019-02-27 DIAGNOSIS — R110 Nausea: Secondary | ICD-10-CM | POA: Insufficient documentation

## 2019-02-27 DIAGNOSIS — R519 Headache, unspecified: Secondary | ICD-10-CM | POA: Insufficient documentation

## 2019-02-27 LAB — BASIC METABOLIC PANEL
Anion gap: 13 (ref 5–15)
BUN: 11 mg/dL (ref 6–20)
CO2: 21 mmol/L — ABNORMAL LOW (ref 22–32)
Calcium: 9.3 mg/dL (ref 8.9–10.3)
Chloride: 102 mmol/L (ref 98–111)
Creatinine, Ser: 0.81 mg/dL (ref 0.44–1.00)
GFR calc Af Amer: >60 mL/min (ref >60)
GFR calc non Af Amer: >60 mL/min (ref >60)
Glucose, Bld: 141 mg/dL — ABNORMAL HIGH (ref 70–99)
Potassium: 3.6 mmol/L (ref 3.5–5.1)
Sodium: 136 mmol/L (ref 135–145)

## 2019-02-27 LAB — URINALYSIS, ROUTINE W REFLEX MICROSCOPIC
Bilirubin Urine: NEGATIVE
Glucose, UA: NEGATIVE mg/dL
Ketones, ur: 5 mg/dL — AB
Leukocytes,Ua: NEGATIVE
Nitrite: NEGATIVE
Protein, ur: NEGATIVE mg/dL
Specific Gravity, Urine: 1.003 — ABNORMAL LOW (ref 1.005–1.030)
pH: 6 (ref 5.0–8.0)

## 2019-02-27 LAB — TROPONIN I (HIGH SENSITIVITY)
Troponin I (High Sensitivity): <2 ng/L (ref <18)
Troponin I (High Sensitivity): <2 ng/L (ref <18)

## 2019-02-27 LAB — CBC
HCT: 38.4 % (ref 36.0–46.0)
Hemoglobin: 13.2 g/dL (ref 12.0–15.0)
MCH: 31.7 pg (ref 26.0–34.0)
MCHC: 34.4 g/dL (ref 30.0–36.0)
MCV: 92.3 fL (ref 80.0–100.0)
Platelets: 192 10*3/uL (ref 150–400)
RBC: 4.16 MIL/uL (ref 3.87–5.11)
RDW: 11.4 % — ABNORMAL LOW (ref 11.5–15.5)
WBC: 5.1 10*3/uL (ref 4.0–10.5)
nRBC: 0 % (ref 0.0–0.2)

## 2019-02-27 LAB — POC SARS CORONAVIRUS 2 AG -  ED: SARS Coronavirus 2 Ag: NEGATIVE

## 2019-02-27 LAB — I-STAT BETA HCG BLOOD, ED (MC, WL, AP ONLY): I-stat hCG, quantitative: <5 m[IU]/mL (ref <5)

## 2019-02-27 MED ORDER — DIPHENHYDRAMINE HCL 50 MG/ML IJ SOLN
25.0000 mg | Freq: Once | INTRAMUSCULAR | Status: AC
  Administered 2019-02-27: 25 mg via INTRAVENOUS
  Filled 2019-02-27: qty 1

## 2019-02-27 MED ORDER — KETOROLAC TROMETHAMINE 15 MG/ML IJ SOLN
15.0000 mg | Freq: Once | INTRAMUSCULAR | Status: AC
  Administered 2019-02-27: 16:00:00 15 mg via INTRAMUSCULAR
  Filled 2019-02-27: qty 1

## 2019-02-27 MED ORDER — SODIUM CHLORIDE 0.9% FLUSH
3.0000 mL | Freq: Once | INTRAVENOUS | Status: AC
  Administered 2019-02-27: 14:00:00 3 mL via INTRAVENOUS

## 2019-02-27 MED ORDER — BUTALBITAL-APAP-CAFFEINE 50-325-40 MG PO TABS
1.0000 | ORAL_TABLET | Freq: Once | ORAL | Status: AC
  Administered 2019-02-27: 1 via ORAL
  Filled 2019-02-27: qty 1

## 2019-02-27 MED ORDER — METOCLOPRAMIDE HCL 5 MG/ML IJ SOLN
10.0000 mg | Freq: Once | INTRAMUSCULAR | Status: AC
  Administered 2019-02-27: 10 mg via INTRAVENOUS
  Filled 2019-02-27: qty 2

## 2019-02-27 NOTE — ED Notes (Signed)
Patient transported to MRI 

## 2019-02-27 NOTE — ED Triage Notes (Signed)
Pt here with c/o chest and  and numbness to her left side of her have , nausea and no vomiting , pt had 324mg  asa and no nitro

## 2019-02-27 NOTE — ED Provider Notes (Signed)
[facility] EMERGENCY DEPARTMENT Provider Note   CSN: [id number] Arrival date & time: 02/27/19  1154     History Chief Complaint  Patient presents with  . Chest Pain    [place] Bobrowski is a 44 y.o. female presenting for evaluation of headache, left-sided facial numbness, chest discomfort, nausea.   Patient states 2 days ago she started develop a headache.  Headache was mostly in the left side of her head.  It was constant over the past 2 days, improved slightly with Advil before returning.  Patient states while at work today she felt like the left middle side of her face started to go numb.  From there, the numbness spread to include her whole left side face.  Patient states she then felt some discomfort in her left upper chest, she felt nauseous, and dizzy.  A coworker started to bring her to the ER, but she felt she could not make it as she was convinced she was going to die.  As such, EMS was called.  She denies recent fevers, chills, loss of taste or smell, cough, shortness of breath, abdominal pain, urinary symptoms, abnormal bowel movements.  Patient states she had vaginal bleeding beginning 2 days ago, which has been slowing.  She has an IUD, has been present for 4.5 years.  She reports no other medical problems, takes medications daily.  Her son who attends daycare has nasal congestion, as such both patient and her son were tested for Covid 2 days ago when her symptoms began, was negative.  HPI     Past Medical History:  Diagnosis Date  . Medical history non-contributory     Patient Active Problem List   Diagnosis Date Noted  . SVD (spontaneous vaginal delivery) 11/08/2014  . Indication for care in labor or delivery 11/07/2014    Past Surgical History:  Procedure Laterality Date  . RECTAL SURGERY     as a child had repair due to injury  . WISDOM TOOTH EXTRACTION Bilateral 1997     OB History    Gravida  1   Para  1   Term  1   Preterm    0   AB  0   Living  1     SAB  0   TAB  0   Ectopic  0   Multiple  0   Live Births  1           Family History  Problem Relation Age of Onset  . Thyroid disease Mother   . Diabetes Father   . Heart disease Father        stints  . Emphysema Paternal Grandmother     Social History   Tobacco Use  . Smoking status: Never Smoker  . Smokeless tobacco: Never Used  Substance Use Topics  . Alcohol use: Yes    Alcohol/week: 5.0 standard drinks    Types: 5 Standard drinks or equivalent per week  . Drug use: No    Home Medications Prior to Admission medications   Medication Sig Start Date End Date Taking? Authorizing Provider  ibuprofen (ADVIL,MOTRIN) 600 MG tablet Take 1 tablet (600 mg total) by mouth every 6 (six) hours as needed. 11/10/14   Everlene Farrier, MD  oxyCODONE-acetaminophen (PERCOCET/ROXICET) 5-325 MG per tablet Take 1-2 tablets by mouth every 6 (six) hours as needed (for pain scale 4-7). 11/10/14   Everlene Farrier, MD  Prenatal Vit-Fe Fumarate-FA (PRENATAL MULTIVITAMIN) TABS tablet Take 1 tablet by  mouth at bedtime.    [provider]  ranitidine (ZANTAC) 75 MG tablet Take 75 mg by mouth 2 (two) times daily.    [provider]    Allergies    Patient has no known allergies.  Review of Systems   Review of Systems  Cardiovascular: Positive for chest pain.  Gastrointestinal: Positive for nausea.  Genitourinary: Positive for vaginal bleeding.  Neurological: Positive for dizziness, numbness and headaches.  All other systems reviewed and are negative.   Physical Exam Updated Vital Signs BP 110/65   Pulse 73   Temp (!) 97.5 F (36.4 C) (Oral)   Resp 15   LMP 02/25/2019   SpO2 96%   Physical Exam Vitals and nursing note reviewed.  Constitutional:      General: She is not in acute distress.    Appearance: She is well-developed.  HENT:     Head: Normocephalic and atraumatic.  Eyes:     Extraocular Movements: Extraocular  movements intact.     Conjunctiva/sclera: Conjunctivae normal.     Pupils: Pupils are equal, round, and reactive to light.     Comments: EOMI and PERRLA. No nystagmus  Cardiovascular:     Rate and Rhythm: Normal rate and regular rhythm.     Pulses: Normal pulses.  Pulmonary:     Effort: Pulmonary effort is normal. No respiratory distress.     Breath sounds: Normal breath sounds. No wheezing.     Comments: Speaking in full sentences.  Clear lung sounds in all fields. Abdominal:     General: There is no distension.     Palpations: Abdomen is soft. There is no mass.     Tenderness: There is no abdominal tenderness. There is no guarding or rebound.  Musculoskeletal:        General: Normal range of motion.     Cervical back: Normal range of motion and neck supple.     Right lower leg: No edema.     Left lower leg: No edema.     Comments: Strength and sensation intact x4.  Radial and pedal pulses 2+ bilaterally.  Skin:    General: Skin is warm and dry.     Capillary Refill: Capillary refill takes less than 2 seconds.  Neurological:     Mental Status: She is alert and oriented to person, place, and time.     GCS: GCS eye subscore is 4. GCS verbal subscore is 5. GCS motor subscore is 6.     Motor: Motor function is intact.     Coordination: Coordination is intact.     Comments: Question slight weakness on the R side face with smile and when patient puffs out her cheeks, although this appears to be effort dependent.  Otherwise no CN deficits. Fine movement and coordination intact.  Nose to finger intact.  Patient reports decreased sensation when touching the left side of her face and her left arm. Negative pronators drift     ED Results / Procedures / Treatments   Labs (all labs ordered are listed, but only abnormal results are displayed) Labs Reviewed  BASIC METABOLIC PANEL - Abnormal; Notable for the following components:      Result Value   CO2 21 (*)    Glucose, Bld 141 (*)     All other components within normal limits  CBC - Abnormal; Notable for the following components:   RDW 11.4 (*)    All other components within normal limits  URINALYSIS, ROUTINE W REFLEX MICROSCOPIC -  Abnormal; Notable for the following components:   Color, Urine STRAW (*)    Specific Gravity, Urine 1.003 (*)    Hgb urine dipstick MODERATE (*)    Ketones, ur 5 (*)    Bacteria, UA RARE (*)    All other components within normal limits  I-STAT BETA HCG BLOOD, ED ([facility], [facility], [facility] ONLY)  POC SARS CORONAVIRUS 2 AG -  ED  TROPONIN I (HIGH SENSITIVITY)  TROPONIN I (HIGH SENSITIVITY)    EKG EKG Interpretation  Date/Time:  Monday February 27 2019 12:18:08 EST Ventricular Rate:  73 PR Interval:  178 QRS Duration: 72 QT Interval:  410 QTC Calculation: 451 R Axis:   58 Text Interpretation: Normal sinus rhythm Low voltage QRS Nonspecific T wave abnormality Abnormal ECG No prior ECG for comparison. No STEMI Confirmed by Theda Belfast ([id number]) on 02/27/2019 1:43:46 PM   Radiology DG Chest 2 View  Result Date: 02/27/2019 CLINICAL DATA:  Left-sided chest pain EXAM: CHEST - 2 VIEW COMPARISON:  None. FINDINGS: The heart size and mediastinal contours are within normal limits. Both lungs are clear. The visualized skeletal structures are unremarkable. IMPRESSION: No active cardiopulmonary disease. Electronically Signed   By: Duanne Guess D.O.   On: 02/27/2019 12:58   CT Head Wo Contrast  Result Date: 02/27/2019 CLINICAL DATA:  Headache with left-sided facial numbness EXAM: CT HEAD WITHOUT CONTRAST TECHNIQUE: Contiguous axial images were obtained from the base of the skull through the vertex without intravenous contrast. COMPARISON:  None. FINDINGS: Brain: The ventricles are normal in size and configuration. There is no intracranial mass, hemorrhage, extra-axial fluid collection, or midline shift. The brain parenchyma appears unremarkable. No evident acute infarct. Vascular: No hyperdense vessel.  No  evident vascular calcification. Skull: The bony calvarium appears intact. Sinuses/Orbits: There is mild mucosal thickening in several ethmoid air cells. Other visualized paranasal sinuses are clear. Orbits appear symmetric bilaterally. Other: Mastoid air cells are clear. IMPRESSION: Mild mucosal thickening in several ethmoid air cells. Study otherwise unremarkable.4 Electronically Signed   By: Bretta Bang III M.D.   On: 02/27/2019 13:17    Procedures Procedures (including critical care time)  Medications Ordered in ED Medications  sodium chloride flush (NS) 0.9 % injection 3 mL (3 mLs Intravenous Given 02/27/19 1348)  metoCLOPramide (REGLAN) injection 10 mg (10 mg Intravenous Given 02/27/19 1459)  diphenhydrAMINE (BENADRYL) injection 25 mg (25 mg Intravenous Given 02/27/19 1459)  ketorolac (TORADOL) 15 MG/ML injection 15 mg (15 mg Intramuscular Given 02/27/19 1548)    ED Course  I have reviewed the triage vital signs and the nursing notes.  Pertinent labs & imaging results that were available during my care of the patient were reviewed by me and considered in my medical decision making (see chart for details).    MDM Rules/Calculators/A&P                      Patient presenting for evaluation of headache, numbness, chest pain, nausea, dizziness.  Physical exam shows patient who appears nontoxic.  Patient's neuro exam shows maybe some slight right-sided weakness with the cranial nerves, however numbness is on the left side.  Additionally, patient's weakness appears to be effort dependent.  As such, I have a low suspicion for stroke, however will obtain imaging for this.  Labs obtained from triage are reassuring, delta troponin negative x2.  Hemoglobin stable.  No leukocytosis.  Electrolytes stable.  Patient reported vaginal bleeding, but no vaginal discharge or lower abdominal pain.  As  her hemoglobin is stable, I do not believe this is contributing to her symptoms today.  I have a low  suspicion for ACS, she has no risk factors and delta troponin is negative.  EKG without STEMI.  Chest x-ray viewed interpreted by me, no pneumonia, pneumothorax, effusion, cardiomegaly.  CT head obtained from triage was negative.  Consider complex migraine versus Covid versus anxiety.  Will give headache cocktail and obtain MRI.  Pt signed out to [facility], PA-C for f/u on MRI and reassessment after HA cocktail.   Final Clinical Impression(s) / ED Diagnoses Final diagnoses:  None    Rx / DC Orders ED Discharge Orders    None       Alveria Apley, PA-C 02/27/19 1608    Tegeler, Canary Brim, MD 02/27/19 1625

## 2019-02-27 NOTE — Discharge Instructions (Addendum)
You were seen in the emergency department today for a headache, numbness, and chest discomfort.  Your work-up was overall reassuring.  Your chest x-ray was normal.  Your imaging of your head did not show signs of a stroke or other concerning abnormalities.  Your labs were overall reassuring.  Your EKG and heart enzymes did not show signs of a heart attack.  Please try to rest over the next 24 hours.  Please follow-up with neurology as well as cardiology within the next 48 hours for reevaluation.  Return to the ER for new or worsening symptoms or any other concerns.

## 2019-02-27 NOTE — ED Provider Notes (Signed)
15:30: Assumed care of patient from Dillard PA-C at change of shift pending MRI brain & disposition, if MRI without acute process plan for discharge home  Please see provider note for full H&P.  Briefly patient is a 44 yo female who presented with headache for past few days with development of L sided facial & LUE numbness today as well as some mild chest discomfort. .   ACS or other life threatening cause of chest pain felt to be less likely by prior team per history as well as patient having reassuring EKG, cardiac enzymes & CXR. CT head without significant acute abnormalities. Plan is for MRI and if negative discharge home.   Results for orders placed or performed during the hospital encounter of 38/46/65  Basic metabolic panel  Result Value Ref Range   Sodium 136 135 - 145 mmol/L   Potassium 3.6 3.5 - 5.1 mmol/L   Chloride 102 98 - 111 mmol/L   CO2 21 (L) 22 - 32 mmol/L   Glucose, Bld 141 (H) 70 - 99 mg/dL   BUN 11 6 - 20 mg/dL   Creatinine, Ser 0.81 0.44 - 1.00 mg/dL   Calcium 9.3 8.9 - 10.3 mg/dL   GFR calc non Af Amer >60 >60 mL/min   GFR calc Af Amer >60 >60 mL/min   Anion gap 13 5 - 15  CBC  Result Value Ref Range   WBC 5.1 4.0 - 10.5 K/uL   RBC 4.16 3.87 - 5.11 MIL/uL   Hemoglobin 13.2 12.0 - 15.0 g/dL   HCT 38.4 36.0 - 46.0 %   MCV 92.3 80.0 - 100.0 fL   MCH 31.7 26.0 - 34.0 pg   MCHC 34.4 30.0 - 36.0 g/dL   RDW 11.4 (L) 11.5 - 15.5 %   Platelets 192 150 - 400 K/uL   nRBC 0.0 0.0 - 0.2 %  Urinalysis, Routine w reflex microscopic  Result Value Ref Range   Color, Urine STRAW (A) YELLOW   APPearance CLEAR CLEAR   Specific Gravity, Urine 1.003 (L) 1.005 - 1.030   pH 6.0 5.0 - 8.0   Glucose, UA NEGATIVE NEGATIVE mg/dL   Hgb urine dipstick MODERATE (A) NEGATIVE   Bilirubin Urine NEGATIVE NEGATIVE   Ketones, ur 5 (A) NEGATIVE mg/dL   Protein, ur NEGATIVE NEGATIVE mg/dL   Nitrite NEGATIVE NEGATIVE   Leukocytes,Ua NEGATIVE NEGATIVE   RBC / HPF 0-5 0 - 5  RBC/hpf   WBC, UA 0-5 0 - 5 WBC/hpf   Bacteria, UA RARE (A) NONE SEEN   Squamous Epithelial / LPF 0-5 0 - 5  I-Stat beta hCG blood, ED  Result Value Ref Range   I-stat hCG, quantitative <5.0 <5 mIU/mL   Comment 3          POC SARS Coronavirus 2 Ag-ED - Nasal Swab (BD Veritor Kit)  Result Value Ref Range   SARS Coronavirus 2 Ag NEGATIVE NEGATIVE  Troponin I (High Sensitivity)  Result Value Ref Range   Troponin I (High Sensitivity) <2 <18 ng/L  Troponin I (High Sensitivity)  Result Value Ref Range   Troponin I (High Sensitivity) <2 <18 ng/L   DG Chest 2 View  Result Date: 02/27/2019 CLINICAL DATA:  Left-sided chest pain EXAM: CHEST - 2 VIEW COMPARISON:  None. FINDINGS: The heart size and mediastinal contours are within normal limits. Both lungs are clear. The visualized skeletal structures are unremarkable. IMPRESSION: No active cardiopulmonary disease. Electronically Signed   By: Davina Poke D.O.  On: 02/27/2019 12:58   CT Head Wo Contrast  Result Date: 02/27/2019 CLINICAL DATA:  Headache with left-sided facial numbness EXAM: CT HEAD WITHOUT CONTRAST TECHNIQUE: Contiguous axial images were obtained from the base of the skull through the vertex without intravenous contrast. COMPARISON:  None. FINDINGS: Brain: The ventricles are normal in size and configuration. There is no intracranial mass, hemorrhage, extra-axial fluid collection, or midline shift. The brain parenchyma appears unremarkable. No evident acute infarct. Vascular: No hyperdense vessel.  No evident vascular calcification. Skull: The bony calvarium appears intact. Sinuses/Orbits: There is mild mucosal thickening in several ethmoid air cells. Other visualized paranasal sinuses are clear. Orbits appear symmetric bilaterally. Other: Mastoid air cells are clear. IMPRESSION: Mild mucosal thickening in several ethmoid air cells. Study otherwise unremarkable.4 Electronically Signed   By: Lowella Grip III M.D.   On: 02/27/2019  13:17   MR Brain Wo Contrast (neuro protocol)  Result Date: 02/27/2019 CLINICAL DATA:  Focal neuro deficit, greater than 6 hours, stroke suspected. Additional history provided: Headache, left-sided facial numbness, chest discomfort, nausea. EXAM: MRI HEAD WITHOUT CONTRAST TECHNIQUE: Multiplanar, multiecho pulse sequences of the brain and surrounding structures were obtained without intravenous contrast. COMPARISON:  Head CT 02/27/2019 FINDINGS: Brain: There is no evidence of acute infarct. No evidence of intracranial mass. No midline shift or extra-axial fluid collection. No chronic intracranial blood products. Single nonspecific punctate focus of T2/FLAIR hyperintensity within the left frontal lobe white matter (series 8, image 17). Cerebral volume is normal for age. Vascular: Flow voids maintained within the proximal large arterial vessels. Skull and upper cervical spine: No focal marrow lesion Sinuses/Orbits: Visualized orbits demonstrate no acute abnormality. Trace ethmoid sinus mucosal thickening. No significant mastoid effusion. IMPRESSION: No evidence of acute intracranial abnormality, including acute infarct. Electronically Signed   By: Kellie Simmering DO   On: 02/27/2019 18:05    MRI w/o acute process- specifically no acute infarct.  Her additional work up has been reviewed: EKG without STEMI, trop < 2, no anemia, no electrolyte derangement, preg negative, mild hematuria- patient with vaginal spotting. CXR without acute process.   Patient had some improvement with migraine cocktail then regressed while in MRI from being in uncomfortable position, given Fioricet with some mild improvement. Will discharge home with neurology follow up. I discussed results, treatment plan, need for follow-up, and return precautions with the patient. Provided opportunity for questions, patient confirmed understanding and is in agreement with plan.     Leafy Kindle 02/27/19 2058    Dorie Rank,  MD 02/28/19 1205

## 2019-03-01 NOTE — Progress Notes (Signed)
Cardiology Office Note   Date:  03/02/2019   ID:  Patricia Olds, [place] Meo 10-20-1975, MRN [id number]  PCP:  Kandyce Rud, MD  Cardiologist:   No primary care provider on file.   Chief Complaint  Patient presents with  . Chest Pain      History of Present Illness: Patricia Ortega is a 44 y.o. female who was sent from the ED for evaluation of chest pain.  She also had a headache.  I reviewed these records for this visit.   She had a negative MRI and EKG and enzymes were negative.  The patient has no past cardiac history.  On Saturday she had a headache.  On Sunday she felt poorly with a headache off and on.  Monday she was at work and she developed some left-sided numbness of her cheek.  She felt lightheaded.  She initially was going to be driven to the emergency room but she called 911 after she felt like her heart would "explode".  She felt like she was "going to die.".  She was not describing necessarily tachypalpitations.  The discomfort was somewhat difficult to quantify or qualify.  There was lightheadedness.  She had an extensive work-up in the ER which are reviewed with her.  The only thing that we could see on her CT was some ethmoid thickening.  There was no obvious sinusitis.  There were no acute findings on head CT or the MRI.  Cardiac labs were unremarkable.  EKG was unremarkable except for poor anterior R wave progression.  She is not had any prior cardiac work-up or symptoms.  Of note the predominant symptom was really numbness under the left eye and a vague headache.  Past Medical History:  Diagnosis Date  . Medical history non-contributory     Past Surgical History:  Procedure Laterality Date  . RECTAL SURGERY     as a child had repair due to injury  . WISDOM TOOTH EXTRACTION Bilateral 1997     Current Outpatient Medications  Medication Sig Dispense Refill  . cetirizine (ZYRTEC) 10 MG chewable tablet Chew 10 mg by mouth daily.    . Probiotic  Product (ALIGN) 4 MG CAPS Take by mouth.    Marland Kitchen ibuprofen (ADVIL,MOTRIN) 600 MG tablet Take 1 tablet (600 mg total) by mouth every 6 (six) hours as needed. (Patient not taking: Reported on 03/02/2019) 30 tablet 0  . oxyCODONE-acetaminophen (PERCOCET/ROXICET) 5-325 MG per tablet Take 1-2 tablets by mouth every 6 (six) hours as needed (for pain scale 4-7). (Patient not taking: Reported on 03/02/2019) 20 tablet 0  . Prenatal Vit-Fe Fumarate-FA (PRENATAL MULTIVITAMIN) TABS tablet Take 1 tablet by mouth at bedtime.    . ranitidine (ZANTAC) 75 MG tablet Take 75 mg by mouth 2 (two) times daily.     No current facility-administered medications for this visit.    Allergies:   Patient has no known allergies.    Social History:  The patient  reports that she has never smoked. She has never used smokeless tobacco. She reports current alcohol use of about 5.0 standard drinks of alcohol per week. She reports that she does not use drugs.   Family History:  The patient's family history includes Diabetes in her father; Emphysema in her paternal grandmother; Heart disease (age of onset: 67) in her father; Thyroid disease in her mother.    ROS:  Please see the history of present illness.   Otherwise, review of systems are positive for none.  All other systems are reviewed and negative.    PHYSICAL EXAM: VS:  BP 117/77   Pulse 72   Temp 98.1 F (36.7 C)   Ht 5\' 8"  (1.727 m)   Wt 169 lb (76.7 kg)   LMP 02/25/2019   SpO2 99%   BMI 25.70 kg/m  , BMI Body mass index is 25.7 kg/m. GENERAL:  Well appearing HEENT:  Pupils equal round and reactive, fundi not visualized, oral mucosa unremarkable NECK:  No jugular venous distention, waveform within normal limits, carotid upstroke brisk and symmetric, no bruits, no thyromegaly LYMPHATICS:  No cervical, inguinal adenopathy LUNGS:  Clear to auscultation bilaterally BACK:  No CVA tenderness CHEST:  Unremarkable HEART:  PMI not displaced or sustained,S1 and S2  within normal limits, no S3, no S4, no clicks, no rubs, no murmurs ABD:  Flat, positive bowel sounds normal in frequency in pitch, no bruits, no rebound, no guarding, no midline pulsatile mass, no hepatomegaly, no splenomegaly EXT:  2 plus pulses throughout, no edema, no cyanosis no clubbing SKIN:  No rashes no nodules NEURO:  Cranial nerves II through XII grossly intact, motor grossly intact throughout PSYCH:  Cognitively intact, oriented to person place and time    EKG:  EKG is ordered today. The ekg ordered today demonstrates sinus rhythm, rate 72, axis within normal limits, intervals within normal limits, no acute ST-T wave changes, poor anterior R wave progression.   Recent Labs: 02/27/2019: BUN 11; Creatinine, Ser 0.81; Hemoglobin 13.2; Platelets 192; Potassium 3.6; Sodium 136    Lipid Panel No results found for: CHOL, TRIG, HDL, CHOLHDL, VLDL, LDLCALC, LDLDIRECT    Wt Readings from Last 3 Encounters:  03/02/19 169 lb (76.7 kg)  11/07/14 195 lb (88.5 kg)  10/28/14 195 lb (88.5 kg)      Other studies Reviewed: Additional studies/ records that were reviewed today include: ED records. Review of the above records demonstrates:  Please see elsewhere in the note.     ASSESSMENT AND PLAN:  CHEST PAIN:   Her chest pain was atypical.  I reviewed the EKG and labs and there was no objective evidence of ischemia.  I have a low suspicion for obstructive coronary disease as the etiology.  However, I will start with a coronary calcium score given her family history.  Further evaluation will be based on these results.  I asked her to check a rhythm in the future and suggested that she get an Alive Cor.  I also suggested a blood pressure cuff.  She certainly could let me know if she has any further episodes and I would suggest further evaluation.  ABNORMAL EKG: She does have poor anterior R wave placement.  A follow-up after the CT and consider whether to do any other testing such as an  echocardiogram.  Again I do not strongly suspect an anterior MI and suspect that this would lead placement.  HEADACHE: I have suggested possibly starting with ENT given the slightly abnormal CT findings.  Current medicines are reviewed at length with the patient today.  The patient does not have concerns regarding medicines.  The following changes have been made:  no change  Labs/ tests ordered today include:   Orders Placed This Encounter  Procedures  . CT CARDIAC SCORING  . TSH  . EKG 12-Lead     Disposition:   FU with me based on the results of the above    Signed, Minus Breeding, MD  03/02/2019 3:46 PM    [facility]

## 2019-03-02 ENCOUNTER — Encounter: Payer: Self-pay | Admitting: Cardiology

## 2019-03-02 ENCOUNTER — Other Ambulatory Visit: Payer: Self-pay

## 2019-03-02 ENCOUNTER — Ambulatory Visit (INDEPENDENT_AMBULATORY_CARE_PROVIDER_SITE_OTHER): Payer: Managed Care, Other (non HMO) | Admitting: Cardiology

## 2019-03-02 VITALS — BP 117/77 | HR 72 | Temp 98.1°F | Ht 68.0 in | Wt 169.0 lb

## 2019-03-02 DIAGNOSIS — R079 Chest pain, unspecified: Secondary | ICD-10-CM

## 2019-03-02 NOTE — Patient Instructions (Addendum)
Medication Instructions:  No changes *If you need a refill on your cardiac medications before your next appointment, please call your pharmacy*  Lab Work: Your physician recommends that you return for lab work today. (TSH)   Testing/Procedures: CT Cardiac Scoring  Follow-Up: At [facility], you and your health needs are our priority.  As part of our continuing mission to provide you with exceptional heart care, we have created designated Provider Care Teams.  These Care Teams include your primary Cardiologist (physician) and Advanced Practice Providers (APPs -  Physician Assistants and Nurse Practitioners) who all work together to provide you with the care you need, when you need it.   Other Instructions: Follow up as needed

## 2019-03-03 ENCOUNTER — Telehealth: Payer: Self-pay

## 2019-03-03 DIAGNOSIS — R079 Chest pain, unspecified: Secondary | ICD-10-CM

## 2019-03-03 LAB — TSH: TSH: 7.48 u[IU]/mL — ABNORMAL HIGH (ref 0.450–4.500)

## 2019-03-03 NOTE — Telephone Encounter (Signed)
Spoke with patient. Patient aware TSH results and of T3 and T4 lab test needing to be done, verbalized understanding. Patient will come in next week for lab work.

## 2019-03-03 NOTE — Telephone Encounter (Signed)
-----   Message from Rollene Rotunda, MD sent at 03/03/2019  4:39 PM EST ----- TSH is off.  Check T3 and T4

## 2019-03-06 ENCOUNTER — Encounter: Payer: Self-pay | Admitting: Neurology

## 2019-03-06 ENCOUNTER — Other Ambulatory Visit: Payer: Self-pay

## 2019-03-06 ENCOUNTER — Ambulatory Visit (INDEPENDENT_AMBULATORY_CARE_PROVIDER_SITE_OTHER): Payer: Managed Care, Other (non HMO) | Admitting: Neurology

## 2019-03-06 VITALS — BP 116/79 | HR 57 | Temp 97.3°F | Ht 68.0 in | Wt 168.3 lb

## 2019-03-06 DIAGNOSIS — G43009 Migraine without aura, not intractable, without status migrainosus: Secondary | ICD-10-CM | POA: Diagnosis not present

## 2019-03-06 LAB — T4: T4, Total: 5 ug/dL (ref 4.5–12.0)

## 2019-03-06 LAB — T3: T3, Total: 70 ng/dL — ABNORMAL LOW (ref 71–180)

## 2019-03-06 MED ORDER — UBRELVY 50 MG PO TABS: 50.0000 mg | ORAL_TABLET | ORAL | 3 refills | Status: DC | PRN

## 2019-03-06 NOTE — Progress Notes (Signed)
Subjective:    Patient ID: Patricia Ortega is a 44 y.o. female.  HPI     Huston Foley, MD, PhD [facility] [address], [address] [address] [place], [place] [address]  I saw Ms. Patricia Ortega as a referral from the emergency room for headache.  The patient is unaccompanied today.  She is a 44 year old right-handed woman with an underlying benign medical history, who reports new onset headaches for the past week.  She states that she woke up with a milder headache, during her workday she developed dizziness upon standing up, she felt palpitations and shortness of breath and started having a throbbing headache bilaterally, particularly in the temporal areas, she also had some nausea, no vomiting.  She felt globally weak and started having numbness in her face about 2 days later.  She then presented to the emergency room via 911 on 02/27/2019 with a 2-day history of recurrent headaches and left-sided facial numbness.  She had on examination a questionable may be effort related weakness in the right side, some numbness on the left side of her face, was treated symptomatically with IV fluids, Reglan, Benadryl and Toradol.  She had a head CT without contrast as well as brain MRI without contrast.   She had a head CT without contrast on 02/27/2019 and I reviewed the results: IMPRESSION: Mild mucosal thickening in several ethmoid air cells. Study otherwise unremarkable. She had a Brain MRI without contrast on 02/27/2019 and I reviewed the results: IMPRESSION: No evidence of acute intracranial abnormality, including acute infarct.   She has since then seen cardiology.  She is scheduled for a cardiac calcium study, had an elevated TSH recently at 7.48 and is going to have T3 and T4 checked today.  She also has a follow-up appointment with her primary care physician tomorrow.  She denies any telltale triggers, overall her headache is better but still seems to come and go, it  seems to be a throbbing sensation, denies any vertigo symptoms, hearing loss, tinnitus, sinus congestion or drainage.  She denies any visual symptoms or aura.  She has prescription eyeglasses for use at the computer, has not had a formal eye examination in a few years.  She denies any new symptoms but still has some numbness she reports in the left mid face area.  She has no prior history of migraines, no family history of migraines.  She admits that she does not sleep very well and attributes this to having a 37-year-old and her husband snores which disturbs her.  She denies any snoring herself waking up with a sense of gasping for air.  She does go to the bathroom twice per average night.  Her Past Medical History Is Significant For: Past Medical History:  Diagnosis Date  . Medical history non-contributory     Her Past Surgical History Is Significant For: Past Surgical History:  Procedure Laterality Date  . RECTAL SURGERY     as a child had repair due to injury  . WISDOM TOOTH EXTRACTION Bilateral 1997    Her Family History Is Significant For: Family History  Problem Relation Age of Onset  . Thyroid disease Mother   . Diabetes Father   . Heart disease Father 72       Stents, died age 5  . Emphysema Paternal Grandmother     Her Social History Is Significant For: Social History   Socioeconomic History  . Marital status: Married    Spouse name: Not on  file  . Number of children: 1  . Years of education: Not on file  . Highest education level: Not on file  Occupational History  . Not on file  Tobacco Use  . Smoking status: Never Smoker  . Smokeless tobacco: Never Used  Substance and Sexual Activity  . Alcohol use: Yes    Alcohol/week: 5.0 standard drinks    Types: 5 Standard drinks or equivalent per week  . Drug use: No  . Sexual activity: Yes    Partners: Male    Birth control/protection: Pill  Other Topics Concern  . Not on file  Social History Narrative   Married,  one four year old.     Social Determinants of Health   Financial Resource Strain:   . Difficulty of Paying Living Expenses: Not on file  Food Insecurity:   . Worried About Programme researcher, broadcasting/film/video in the Last Year: Not on file  . Ran Out of Food in the Last Year: Not on file  Transportation Needs:   . Lack of Transportation (Medical): Not on file  . Lack of Transportation (Non-Medical): Not on file  Physical Activity:   . Days of Exercise per Week: Not on file  . Minutes of Exercise per Session: Not on file  Stress:   . Feeling of Stress : Not on file  Social Connections:   . Frequency of Communication with Friends and Family: Not on file  . Frequency of Social Gatherings with Friends and Family: Not on file  . Attends Religious Services: Not on file  . Active Member of Clubs or Organizations: Not on file  . Attends Banker Meetings: Not on file  . Marital Status: Not on file    Her Allergies Are:  No Known Allergies:   Her Current Medications Are:  Outpatient Encounter Medications as of 03/06/2019  Medication Sig  . Aspirin-Acetaminophen-Caffeine (EXCEDRIN PO) Take by mouth. PRN For h/a  . cetirizine (ZYRTEC) 10 MG tablet Take 10 mg by mouth daily.  . NON FORMULARY Essential oil vitamin ( Tri Ease)  . Probiotic Product (PROBIOTIC PO) Take by mouth.  . Ubrogepant (UBRELVY) 50 MG TABS Take 50 mg by mouth as needed (may repeat once in 2 hours. no more than 2 pills in 24 h.).  . [DISCONTINUED] cetirizine (ZYRTEC) 10 MG chewable tablet Chew 10 mg by mouth daily.  . [DISCONTINUED] ibuprofen (ADVIL,MOTRIN) 600 MG tablet Take 1 tablet (600 mg total) by mouth every 6 (six) hours as needed. (Patient not taking: Reported on 03/02/2019)  . [DISCONTINUED] oxyCODONE-acetaminophen (PERCOCET/ROXICET) 5-325 MG per tablet Take 1-2 tablets by mouth every 6 (six) hours as needed (for pain scale 4-7). (Patient not taking: Reported on 03/02/2019)  . [DISCONTINUED] Prenatal Vit-Fe Fumarate-FA  (PRENATAL MULTIVITAMIN) TABS tablet Take 1 tablet by mouth at bedtime.  . [DISCONTINUED] Probiotic Product (ALIGN) 4 MG CAPS Take by mouth.  . [DISCONTINUED] ranitidine (ZANTAC) 75 MG tablet Take 75 mg by mouth 2 (two) times daily.   No facility-administered encounter medications on file as of 03/06/2019.  :  Review of Systems:  Out of a complete 14 point review of systems, all are reviewed and negative with the exception of these symptoms as listed below: Review of Systems  Neurological:       Referred from ED for headaches and left side facial numbness. Patient reports 1 week ago she had a severe headache and facial numbness. She sts these sx are new for her. Reports headache is still present  today but the pain is less.     Objective:  Neurological Exam  Physical Exam Physical Examination:   Vitals:   03/06/19 0813  BP: 116/79  Pulse: (!) 57  Temp: (!) 97.3 F (36.3 C)   General Examination: The patient is a very pleasant 44 y.o. female in no acute distress. She appears well-developed and well-nourished and well groomed.   HEENT: Normocephalic, atraumatic, pupils are equal, round and reactive to light and accommodation. Funduscopic exam is normal with sharp disc margins noted. Extraocular tracking is good without limitation to gaze excursion or nystagmus noted. Normal smooth pursuit is noted. Hearing is grossly intact. Face is symmetric with normal facial animation and normal facial sensation. Speech is clear with no dysarthria noted. There is no hypophonia. There is no lip, neck/head, jaw or voice tremor. Neck is supple with full range of passive and active motion. There are no carotid bruits on auscultation. Oropharynx exam reveals: mild mouth dryness, good dental hygiene and mild airway crowding, due to Small airway entry, Mallampati class II, tonsils are on the smaller side.  Tongue protrudes centrally and palate elevates symmetrically.   Chest: Clear to auscultation without  wheezing, rhonchi or crackles noted.  Heart: S1+S2+0, regular and normal without murmurs, rubs or gallops noted.   Abdomen: Soft, non-tender and non-distended with normal bowel sounds appreciated on auscultation.  Extremities: There is no pitting edema in the distal lower extremities bilaterally. Pedal pulses are intact.  Skin: Warm and dry without trophic changes noted.  Musculoskeletal: exam reveals no obvious joint deformities, tenderness or joint swelling or erythema.   Neurologically:  Mental status: The patient is awake, alert and oriented in all 4 spheres. Her immediate and remote memory, attention, language skills and fund of knowledge are appropriate. There is no evidence of aphasia, agnosia, apraxia or anomia. Speech is clear with normal prosody and enunciation. Thought process is linear. Mood is normal and affect is normal.  Cranial nerves II - XII are as described above under HEENT exam. In addition: shoulder shrug is normal with equal shoulder height noted. Motor exam: Normal bulk, strength and tone is noted. There is no drift, tremor or rebound. Romberg is negative. Reflexes are 2+ throughout. Babinski: Toes are flexor bilaterally. Fine motor skills and coordination: intact with normal finger taps, normal hand movements, normal rapid alternating patting, normal foot taps and normal foot agility.  Cerebellar testing: No dysmetria or intention tremor on finger to nose testing. Heel to shin is unremarkable bilaterally. There is no truncal or gait ataxia.  Sensory exam: intact to light touch, pinprick, vibration, temperature sense in the upper and lower extremities.  Gait, station and balance: She stands easily. No veering to one side is noted. No leaning to one side is noted. Posture is age-appropriate and stance is narrow based. Gait shows normal stride length and normal pace. No problems turning are noted. Tandem walk is unremarkable.   Assessment and Plan:  In summary, [place] is a very pleasant 44 y.o.-year old female with a Benign underlying history, who presents for evaluation of her new onset headaches.  While she does not have any prior history of migraines or family history of migraine headaches, she may have migrainous headaches.  She has no telltale trigger but has been noted to have an abnormal TSH, further blood testing is underway.  She has seen cardiology for symptoms of lightheadedness, palpitations, and shortness of breath.  She is scheduled this week for a cardiac calcium study.  She has an appointment with her primary care physician tomorrow and she is encouraged to follow through with checkup with PCP and cardiology.  Neurological exam is nonfocal and she is largely reassured today.  Nevertheless, she is advised to get a formal eye examination as she has not had an eye exam in a few years and she does have prescription eyeglasses.In addition, we may consider a sleep study as she is not sleeping very well and sleep deprivation as well as underlying sleep disordered breathing potentially could cause recurrent headaches.  We will await how she does with further work-up of her hypothyroidism, And treatment.  For symptomatic treatment of her headaches she is advised to use Tylenol or ibuprofen and alternate but not take anything on a day-to-day basis.  In addition, she is advised to start as needed Ubrelvy 50 mg strength. I am not convinced that she needs migraine preventative treatment quite yet.  She is advised to follow-up routinely to see the nurse practitioner in our office.  I answered all her questions today and she was in agreement.  Huston Foley, MD, PhD

## 2019-03-06 NOTE — Patient Instructions (Addendum)
Your neurological exam is benign thankfully.  Nevertheless, I would recommend that you complete your work-up with the cardiologist and also follow-up with your primary care physician as planned. Your TSH was indeed elevated, you may need to be treated for thyroid dysfunction.  If needed, we can consider a sleep study down the road.  In addition, I recommend that you get a full eye examination done as it has been a few years.  I recommend that you do not take Excedrin on a regular basis, please try to take Tylenol over-the-counter alternating with ibuprofen as needed, in addition, for as needed use for migraine: Start Ubrelvy, 50 mg strength: Take 1 pill at onset of migraine headache, may repeat in 2 hours. May cause sedation and nausea.

## 2019-03-09 ENCOUNTER — Ambulatory Visit (INDEPENDENT_AMBULATORY_CARE_PROVIDER_SITE_OTHER)
Admission: RE | Admit: 2019-03-09 | Discharge: 2019-03-09 | Disposition: A | Discharge status: Home / Self Care | Payer: Managed Care, Other (non HMO) | Source: Ambulatory Visit | Attending: Cardiology | Admitting: Cardiology

## 2019-03-09 ENCOUNTER — Telehealth: Payer: Self-pay

## 2019-03-09 ENCOUNTER — Other Ambulatory Visit: Payer: Self-pay

## 2019-03-09 DIAGNOSIS — R079 Chest pain, unspecified: Secondary | ICD-10-CM

## 2019-03-09 NOTE — Telephone Encounter (Signed)
PA for Bernita Raisin has been submitted to [employer] scripts. Received instant approval.   [id number];Review Type:Prior Auth;Coverage Start Date:02/07/2019;Coverage End Date:03/08/2020; KEY [id number]

## 2019-04-13 ENCOUNTER — Ambulatory Visit: Payer: Managed Care, Other (non HMO) | Attending: Internal Medicine

## 2019-04-13 DIAGNOSIS — Z20822 Contact with and (suspected) exposure to covid-19: Secondary | ICD-10-CM | POA: Insufficient documentation

## 2019-04-14 LAB — NOVEL CORONAVIRUS, NAA: SARS-CoV-2, NAA: NOT DETECTED

## 2019-04-28 ENCOUNTER — Ambulatory Visit: Payer: Managed Care, Other (non HMO) | Attending: Internal Medicine

## 2019-04-28 DIAGNOSIS — Z23 Encounter for immunization: Secondary | ICD-10-CM

## 2019-04-28 NOTE — Progress Notes (Signed)
   Covid-19 Vaccination Clinic  Name:  Alaylah Heatherington    MRN: [id number] DOB: [birth year]  04/28/2019  Ms. Louison was observed post Covid-19 immunization for 15 minutes without incident. She was provided with Vaccine Information Sheet and instruction to access the V-Safe system.   Ms. Swoboda was instructed to call 911 with any severe reactions post vaccine: Marland Kitchen Difficulty breathing  . Swelling of face and throat  . A fast heartbeat  . A bad rash all over body  . Dizziness and weakness   Immunizations Administered    Name Date Dose VIS Date Route   Pfizer COVID-19 Vaccine 04/28/2019  9:51 AM 0.3 mL 01/27/2019 Intramuscular   Manufacturer: [employer], [employer]   Lot: [id number]   NDC: [address]-4

## 2019-05-29 ENCOUNTER — Ambulatory Visit: Payer: Self-pay | Attending: Internal Medicine

## 2019-05-29 DIAGNOSIS — Z23 Encounter for immunization: Secondary | ICD-10-CM

## 2019-05-29 NOTE — Progress Notes (Signed)
   Covid-19 Vaccination Clinic  Name:  Patricia Ortega    MRN: [id number] DOB: Dec 02, 1975  05/29/2019  Patricia Ortega was observed post Covid-19 immunization for 15 minutes without incident. She was provided with Vaccine Information Sheet and instruction to access the V-Safe system.   Patricia Ortega was instructed to call 911 with any severe reactions post vaccine: Marland Kitchen Difficulty breathing  . Swelling of face and throat  . A fast heartbeat  . A bad rash all over body  . Dizziness and weakness   Immunizations Administered    Name Date Dose VIS Date Route   Pfizer COVID-19 Vaccine 05/29/2019  9:40 AM 0.3 mL 01/27/2019 Intramuscular   Manufacturer: [employer], [employer]   Lot: [id number]   NDC: [address]-0

## 2019-06-05 ENCOUNTER — Encounter: Payer: Self-pay | Admitting: Family Medicine

## 2019-06-05 ENCOUNTER — Ambulatory Visit (INDEPENDENT_AMBULATORY_CARE_PROVIDER_SITE_OTHER): Payer: Managed Care, Other (non HMO) | Admitting: Family Medicine

## 2019-06-05 ENCOUNTER — Other Ambulatory Visit: Payer: Self-pay

## 2019-06-05 VITALS — BP 110/72 | HR 65 | Temp 97.0°F | Ht 68.0 in | Wt 170.0 lb

## 2019-06-05 DIAGNOSIS — G43009 Migraine without aura, not intractable, without status migrainosus: Secondary | ICD-10-CM

## 2019-06-05 NOTE — Patient Instructions (Signed)
Use Ubrelvy as needed   Continue close follow up with PCP for thyroid monitoring and anxiety management    Follow up as needed     Migraine Headache A migraine headache is a very strong throbbing pain on one side or both sides of your head. This type of headache can also cause other symptoms. It can last from 4 hours to 3 days. Talk with your doctor about what things may bring on (trigger) this condition. What are the causes? The exact cause of this condition is not known. This condition may be triggered or caused by:  Drinking alcohol.  Smoking.  Taking medicines, such as: ? Medicine used to treat chest pain (nitroglycerin). ? Birth control pills. ? Estrogen. ? Some blood pressure medicines.  Eating or drinking certain products.  Doing physical activity. Other things that may trigger a migraine headache include:  Having a menstrual period.  Pregnancy.  Hunger.  Stress.  Not getting enough sleep or getting too much sleep.  Weather changes.  Tiredness (fatigue). What increases the risk?  Being 70-19 years old.  Being female.  Having a family history of migraine headaches.  Being Caucasian.  Having depression or anxiety.  Being very overweight. What are the signs or symptoms?  A throbbing pain. This pain may: ? Happen in any area of the head, such as on one side or both sides. ? Make it hard to do daily activities. ? Get worse with physical activity. ? Get worse around bright lights or loud noises.  Other symptoms may include: ? Feeling sick to your stomach (nauseous). ? Vomiting. ? Dizziness. ? Being sensitive to bright lights, loud noises, or smells.  Before you get a migraine headache, you may get warning signs (an aura). An aura may include: ? Seeing flashing lights or having blind spots. ? Seeing bright spots, halos, or zigzag lines. ? Having tunnel vision or blurred vision. ? Having numbness or a tingling feeling. ? Having trouble  talking. ? Having weak muscles.  Some people have symptoms after a migraine headache (postdromal phase), such as: ? Tiredness. ? Trouble thinking (concentrating). How is this treated?  Taking medicines that: ? Relieve pain. ? Relieve the feeling of being sick to your stomach. ? Prevent migraine headaches.  Treatment may also include: ? Having acupuncture. ? Avoiding foods that bring on migraine headaches. ? Learning ways to control your body functions (biofeedback). ? Therapy to help you know and deal with negative thoughts (cognitive behavioral therapy). Follow these instructions at home: Medicines  Take over-the-counter and prescription medicines only as told by your doctor.  Ask your doctor if the medicine prescribed to you: ? Requires you to avoid driving or using heavy machinery. ? Can cause trouble pooping (constipation). You may need to take these steps to prevent or treat trouble pooping:  Drink enough fluid to keep your pee (urine) pale yellow.  Take over-the-counter or prescription medicines.  Eat foods that are high in fiber. These include beans, whole grains, and fresh fruits and vegetables.  Limit foods that are high in fat and sugar. These include fried or sweet foods. Lifestyle  Do not drink alcohol.  Do not use any products that contain nicotine or tobacco, such as cigarettes, e-cigarettes, and chewing tobacco. If you need help quitting, ask your doctor.  Get at least 8 hours of sleep every night.  Limit and deal with stress. General instructions      Keep a journal to find out what may bring on your  migraine headaches. For example, write down: ? What you eat and drink. ? How much sleep you get. ? Any change in what you eat or drink. ? Any change in your medicines.  If you have a migraine headache: ? Avoid things that make your symptoms worse, such as bright lights. ? It may help to lie down in a dark, quiet room. ? Do not drive or use heavy  machinery. ? Ask your doctor what activities are safe for you.  Keep all follow-up visits as told by your doctor. This is important. Contact a doctor if:  You get a migraine headache that is different or worse than others you have had.  You have more than 15 headache days in one month. Get help right away if:  Your migraine headache gets very bad.  Your migraine headache lasts longer than 72 hours.  You have a fever.  You have a stiff neck.  You have trouble seeing.  Your muscles feel weak or like you cannot control them.  You start to lose your balance a lot.  You start to have trouble walking.  You pass out (faint).  You have a seizure. Summary  A migraine headache is a very strong throbbing pain on one side or both sides of your head. These headaches can also cause other symptoms.  This condition may be treated with medicines and changes to your lifestyle.  Keep a journal to find out what may bring on your migraine headaches.  Contact a doctor if you get a migraine headache that is different or worse than others you have had.  Contact your doctor if you have more than 15 headache days in a month. This information is not intended to replace advice given to you by your health care provider. Make sure you discuss any questions you have with your health care provider. Document Revised: 05/27/2018 Document Reviewed: 03/17/2018 Elsevier Patient Education  [place].

## 2019-06-05 NOTE — Progress Notes (Addendum)
PATIENT: Patricia Ortega DOB: [birth year]  REASON FOR VISIT: follow up HISTORY FROM: patient  Chief Complaint  Patient presents with  . Follow-up    alone, back rm, migraines      HISTORY OF PRESENT ILLNESS: Today 06/05/19 Patricia Ortega is a 44 y.o. female here today for follow up of migraines. She was started on Ubrelvy for abortive therapy. She was also started on Lexapro by PCP. She has not had to use Ubrelvy. She had one headache following her second dose of COVID vaccine. She did have an event the day following where she had some mild numbness of her face. She was able to continue working and reports symptoms resolved in about 45 minutes. She is unsure if anxiety may play a role. She does feel that she is able to manage stress better now that she is on Lexapro. PCP continues to monitor TSH.   HISTORY: (copied from Dr Teofilo Pod note on 03/06/2019)  I saw [name] as a referral from the emergency room for headache.  The patient is unaccompanied today.  She is a 44 year old right-handed woman with an underlying benign medical history, who reports new onset headaches for the past week.  She states that she woke up with a milder headache, during her workday she developed dizziness upon standing up, she felt palpitations and shortness of breath and started having a throbbing headache bilaterally, particularly in the temporal areas, she also had some nausea, no vomiting.  She felt globally weak and started having numbness in her face about 2 days later.  She then presented to the emergency room via 911 on 02/27/2019 with a 2-day history of recurrent headaches and left-sided facial numbness.  She had on examination a questionable may be effort related weakness in the right side, some numbness on the left side of her face, was treated symptomatically with IV fluids, Reglan, Benadryl and Toradol.  She had a head CT without contrast as well as brain MRI without contrast.     She had a head CT without contrast on 02/27/2019 and I reviewed the results: IMPRESSION: Mild mucosal thickening in several ethmoid air cells. Study otherwise unremarkable. She had a Brain MRI without contrast on 02/27/2019 and I reviewed the results: IMPRESSION: No evidence of acute intracranial abnormality, including acute infarct.  She has since then seen cardiology.  She is scheduled for a cardiac calcium study, had an elevated TSH recently at 7.48 and is going to have T3 and T4 checked today.  She also has a follow-up appointment with her primary care physician tomorrow.  She denies any telltale triggers, overall her headache is better but still seems to come and go, it seems to be a throbbing sensation, denies any vertigo symptoms, hearing loss, tinnitus, sinus congestion or drainage.  She denies any visual symptoms or aura.  She has prescription eyeglasses for use at the computer, has not had a formal eye examination in a few years.  She denies any new symptoms but still has some numbness she reports in the left mid face area.  She has no prior history of migraines, no family history of migraines.  She admits that she does not sleep very well and attributes this to having a 60-year-old and her husband snores which disturbs her.  She denies any snoring herself waking up with a sense of gasping for air.  She does go to the bathroom twice per average night.   REVIEW OF SYSTEMS: Out of a complete  14 system review of symptoms, the patient complains only of the following symptoms, none and all other reviewed systems are negative.   ALLERGIES: No Known Allergies  HOME MEDICATIONS: Outpatient Medications Prior to Visit  Medication Sig Dispense Refill  . Aspirin-Acetaminophen-Caffeine (EXCEDRIN PO) Take by mouth. PRN For h/a    . cetirizine (ZYRTEC) 10 MG tablet Take 10 mg by mouth daily.    Marland Kitchen LEXAPRO 5 MG tablet     . NON FORMULARY Essential oil vitamin ( Tri Ease)    . Probiotic Product  (PROBIOTIC PO) Take by mouth.    . Ubrogepant (UBRELVY) 50 MG TABS Take 50 mg by mouth as needed (may repeat once in 2 hours. no more than 2 pills in 24 h.). 10 tablet 3   No facility-administered medications prior to visit.    PAST MEDICAL HISTORY: Past Medical History:  Diagnosis Date  . Medical history non-contributory     PAST SURGICAL HISTORY: Past Surgical History:  Procedure Laterality Date  . RECTAL SURGERY     as a child had repair due to injury  . WISDOM TOOTH EXTRACTION Bilateral 1997    FAMILY HISTORY: Family History  Problem Relation Age of Onset  . Thyroid disease Mother   . Diabetes Father   . Heart disease Father 27       Stents, died age 72  . Emphysema Paternal Grandmother     SOCIAL HISTORY: Social History   Socioeconomic History  . Marital status: Married    Spouse name: Not on file  . Number of children: 1  . Years of education: Not on file  . Highest education level: Not on file  Occupational History  . Not on file  Tobacco Use  . Smoking status: Never Smoker  . Smokeless tobacco: Never Used  Substance and Sexual Activity  . Alcohol use: Yes    Alcohol/week: 5.0 standard drinks    Types: 5 Standard drinks or equivalent per week  . Drug use: No  . Sexual activity: Yes    Partners: Male    Birth control/protection: Pill  Other Topics Concern  . Not on file  Social History Narrative   Married, one four year old.     Social Determinants of Health   Financial Resource Strain:   . Difficulty of Paying Living Expenses:   Food Insecurity:   . Worried About Programme researcher, broadcasting/film/video in the Last Year:   . Barista in the Last Year:   Transportation Needs:   . Freight forwarder (Medical):   Marland Kitchen Lack of Transportation (Non-Medical):   Physical Activity:   . Days of Exercise per Week:   . Minutes of Exercise per Session:   Stress:   . Feeling of Stress :   Social Connections:   . Frequency of Communication with Friends and Family:    . Frequency of Social Gatherings with Friends and Family:   . Attends Religious Services:   . Active Member of Clubs or Organizations:   . Attends Banker Meetings:   Marland Kitchen Marital Status:   Intimate Partner Violence:   . Fear of Current or Ex-Partner:   . Emotionally Abused:   Marland Kitchen Physically Abused:   . Sexually Abused:       PHYSICAL EXAM  Vitals:   06/05/19 0825  BP: 110/72  Pulse: 65  Temp: (!) 97 F (36.1 C)  Weight: 170 lb (77.1 kg)  Height: 5\' 8"  (1.727 m)   Body mass  index is 25.85 kg/m.  Generalized: Well developed, in no acute distress  Cardiology: normal rate and rhythm, no murmur noted Respiratory: clear to auscultation bilaterally  Neurological examination  Mentation: Alert oriented to time, place, history taking. Follows all commands speech and language fluent Cranial nerve II-XII: Pupils were equal round reactive to light. Extraocular movements were full, visual field were full . Motor: The motor testing reveals 5 over 5 strength of all 4 extremities. Good symmetric motor tone is noted throughout.  Gait and station: Gait is normal.    DIAGNOSTIC DATA (LABS, IMAGING, TESTING) - I reviewed patient records, labs, notes, testing and imaging myself where available.  No flowsheet data found.   Lab Results  Component Value Date   WBC 5.1 02/27/2019   HGB 13.2 02/27/2019   HCT 38.4 02/27/2019   MCV 92.3 02/27/2019   PLT 192 02/27/2019      Component Value Date/Time   NA 136 02/27/2019 1215   K 3.6 02/27/2019 1215   CL 102 02/27/2019 1215   CO2 21 (L) 02/27/2019 1215   GLUCOSE 141 (H) 02/27/2019 1215   BUN 11 02/27/2019 1215   CREATININE 0.81 02/27/2019 1215   CALCIUM 9.3 02/27/2019 1215   GFRNONAA >60 02/27/2019 1215   GFRAA >60 02/27/2019 1215   No results found for: CHOL, HDL, LDLCALC, LDLDIRECT, TRIG, CHOLHDL No results found for: HGBA1C No results found for: VITAMINB12 Lab Results  Component Value Date   TSH 7.480 (H) 03/02/2019         ASSESSMENT AND PLAN 44 y.o. year old female  has a past medical history of Medical history non-contributory. here with     ICD-10-CM   1. Migraine without aura and without status migrainosus, not intractable  G43.009     Sonia Baller is doing well. She has had one mild headache since last being seen. She has not needed Ubrelvy. She did have one, short event of mild facial numbness that resolved with complementary therapy. She feels that Lexapro has helped significantly with managing stress. She is monitored closely for abnormal TSH. She was encouraged to continue current treatment plan. May use Ubrelvy if needed for migraine abortion. Healthy lifestyle habits encouraged. She will continue close follow up with PCP. She will follow up with me as needed. She verbalizes understanding and agreement with this plan.    No orders of the defined types were placed in this encounter.    No orders of the defined types were placed in this encounter.     I spent 15 minutes with the patient. 50% of this time was spent counseling and educating patient on plan of care and medications.    Debbora Presto, FNP-C 06/05/2019, 9:10 AM [facility] [address], [place], [place] [address] [phone]  I reviewed the above note and documentation by the Nurse Practitioner and agree with the history, exam, assessment and plan as outlined above. I was available for consultation. Star Age, MD, PhD [facility])

## 2020-02-13 ENCOUNTER — Telehealth: Payer: Self-pay

## 2020-02-13 NOTE — Telephone Encounter (Signed)
We received a PA for Ubrevly. Completed PA on CMM. (Key: [id number]) sent to [facility]. Should have a determination in 1-3 days.

## 2020-02-19 NOTE — Telephone Encounter (Signed)
Received this notice from [facility] regarding PA approval for ubrelvy: "CaseId:[id number];Status:Approved;Review Type:Prior Auth;Coverage Start Date:01/14/2020;Coverage End Date:02/13/2021."

## 2020-05-04 ENCOUNTER — Emergency Department: Payer: Managed Care, Other (non HMO) | Admitting: Certified Registered Nurse Anesthetist

## 2020-05-04 ENCOUNTER — Encounter
Admission: EM | Disposition: A | Discharge status: Home / Self Care | Payer: Self-pay | Source: Home / Self Care | Attending: Surgery

## 2020-05-04 ENCOUNTER — Other Ambulatory Visit: Payer: Self-pay | Admitting: Surgery

## 2020-05-04 ENCOUNTER — Observation Stay
Admission: EM | Admit: 2020-05-04 | Discharge: 2020-05-05 | Disposition: A | Discharge status: Home / Self Care | Payer: Managed Care, Other (non HMO) | Attending: Surgery | Admitting: Surgery

## 2020-05-04 ENCOUNTER — Encounter: Payer: Self-pay | Admitting: *Deleted

## 2020-05-04 ENCOUNTER — Other Ambulatory Visit: Payer: Self-pay

## 2020-05-04 ENCOUNTER — Emergency Department: Payer: Managed Care, Other (non HMO)

## 2020-05-04 DIAGNOSIS — Z20822 Contact with and (suspected) exposure to covid-19: Secondary | ICD-10-CM | POA: Diagnosis not present

## 2020-05-04 DIAGNOSIS — R109 Unspecified abdominal pain: Secondary | ICD-10-CM | POA: Diagnosis present

## 2020-05-04 DIAGNOSIS — Z7982 Long term (current) use of aspirin: Secondary | ICD-10-CM | POA: Insufficient documentation

## 2020-05-04 DIAGNOSIS — K37 Unspecified appendicitis: Secondary | ICD-10-CM

## 2020-05-04 DIAGNOSIS — K3580 Unspecified acute appendicitis: Secondary | ICD-10-CM | POA: Diagnosis not present

## 2020-05-04 DIAGNOSIS — Z419 Encounter for procedure for purposes other than remedying health state, unspecified: Secondary | ICD-10-CM

## 2020-05-04 DIAGNOSIS — K3589 Other acute appendicitis without perforation or gangrene: Secondary | ICD-10-CM

## 2020-05-04 HISTORY — PX: LAPAROSCOPIC APPENDECTOMY: SHX408

## 2020-05-04 LAB — POC URINE PREG, ED: Preg Test, Ur: NEGATIVE

## 2020-05-04 LAB — URINALYSIS, COMPLETE (UACMP) WITH MICROSCOPIC
Bacteria, UA: NONE SEEN
Bilirubin Urine: NEGATIVE
Glucose, UA: NEGATIVE mg/dL
Ketones, ur: 80 mg/dL — AB
Leukocytes,Ua: NEGATIVE
Nitrite: NEGATIVE
Protein, ur: NEGATIVE mg/dL
Specific Gravity, Urine: 1.024 (ref 1.005–1.030)
pH: 5 (ref 5.0–8.0)

## 2020-05-04 LAB — COMPREHENSIVE METABOLIC PANEL
ALT: 29 U/L (ref 0–44)
AST: 26 U/L (ref 15–41)
Albumin: 4.9 g/dL (ref 3.5–5.0)
Alkaline Phosphatase: 36 U/L — ABNORMAL LOW (ref 38–126)
Anion gap: 10 (ref 5–15)
BUN: 14 mg/dL (ref 6–20)
CO2: 24 mmol/L (ref 22–32)
Calcium: 9.3 mg/dL (ref 8.9–10.3)
Chloride: 101 mmol/L (ref 98–111)
Creatinine, Ser: 0.63 mg/dL (ref 0.44–1.00)
GFR, Estimated: >60 mL/min (ref >60)
Glucose, Bld: 141 mg/dL — ABNORMAL HIGH (ref 70–99)
Potassium: 4.1 mmol/L (ref 3.5–5.1)
Sodium: 135 mmol/L (ref 135–145)
Total Bilirubin: 0.8 mg/dL (ref 0.3–1.2)
Total Protein: 7.5 g/dL (ref 6.5–8.1)

## 2020-05-04 LAB — CBC
HCT: 37.8 % (ref 36.0–46.0)
Hemoglobin: 13.6 g/dL (ref 12.0–15.0)
MCH: 32.6 pg (ref 26.0–34.0)
MCHC: 36 g/dL (ref 30.0–36.0)
MCV: 90.6 fL (ref 80.0–100.0)
Platelets: 231 10*3/uL (ref 150–400)
RBC: 4.17 MIL/uL (ref 3.87–5.11)
RDW: 11.4 % — ABNORMAL LOW (ref 11.5–15.5)
WBC: 10 10*3/uL (ref 4.0–10.5)
nRBC: 0 % (ref 0.0–0.2)

## 2020-05-04 LAB — RESP PANEL BY RT-PCR (FLU A&B, COVID) ARPGX2
Influenza A by PCR: NEGATIVE
Influenza B by PCR: NEGATIVE
SARS Coronavirus 2 by RT PCR: NEGATIVE

## 2020-05-04 LAB — LIPASE, BLOOD: Lipase: 37 U/L (ref 11–51)

## 2020-05-04 SURGERY — APPENDECTOMY, LAPAROSCOPIC
Anesthesia: General

## 2020-05-04 MED ORDER — ESCITALOPRAM OXALATE 10 MG PO TABS
10.0000 mg | ORAL_TABLET | Freq: Every day | ORAL | Status: DC
  Administered 2020-05-05: 10 mg via ORAL
  Filled 2020-05-04 (×2): qty 1

## 2020-05-04 MED ORDER — PROPOFOL 10 MG/ML IV BOLUS
INTRAVENOUS | Status: AC
  Filled 2020-05-04: qty 20

## 2020-05-04 MED ORDER — OXYCODONE HCL 5 MG/5ML PO SOLN: 5.0000 mg | Freq: Once | ORAL | Status: DC | PRN

## 2020-05-04 MED ORDER — MORPHINE SULFATE (PF) 4 MG/ML IV SOLN
4.0000 mg | Freq: Once | INTRAVENOUS | Status: AC
  Administered 2020-05-04: 4 mg via INTRAVENOUS
  Filled 2020-05-04: qty 1

## 2020-05-04 MED ORDER — LACTATED RINGERS IV BOLUS
1000.0000 mL | Freq: Once | INTRAVENOUS | Status: AC
  Administered 2020-05-04: 1000 mL via INTRAVENOUS

## 2020-05-04 MED ORDER — FENTANYL CITRATE (PF) 100 MCG/2ML IJ SOLN
INTRAMUSCULAR | Status: AC
  Administered 2020-05-04: 25 ug via INTRAVENOUS
  Filled 2020-05-04: qty 2

## 2020-05-04 MED ORDER — OXYCODONE HCL 5 MG PO TABS: 5.0000 mg | ORAL_TABLET | Freq: Once | ORAL | Status: DC | PRN

## 2020-05-04 MED ORDER — FENTANYL CITRATE (PF) 100 MCG/2ML IJ SOLN
INTRAMUSCULAR | Status: AC
  Filled 2020-05-04: qty 2

## 2020-05-04 MED ORDER — LIDOCAINE HCL (CARDIAC) PF 100 MG/5ML IV SOSY
PREFILLED_SYRINGE | INTRAVENOUS | Status: DC | PRN
  Administered 2020-05-04: 60 mg via INTRAVENOUS

## 2020-05-04 MED ORDER — MIDAZOLAM HCL 2 MG/2ML IJ SOLN
INTRAMUSCULAR | Status: DC | PRN
  Administered 2020-05-04: 2 mg via INTRAVENOUS

## 2020-05-04 MED ORDER — LIDOCAINE HCL (PF) 2 % IJ SOLN
INTRAMUSCULAR | Status: AC
  Filled 2020-05-04: qty 5

## 2020-05-04 MED ORDER — ONDANSETRON HCL 4 MG/2ML IJ SOLN
INTRAMUSCULAR | Status: AC
  Filled 2020-05-04: qty 2

## 2020-05-04 MED ORDER — PHENYLEPHRINE HCL (PRESSORS) 10 MG/ML IV SOLN
INTRAVENOUS | Status: DC | PRN
  Administered 2020-05-04 (×4): 100 ug via INTRAVENOUS

## 2020-05-04 MED ORDER — FENTANYL CITRATE (PF) 100 MCG/2ML IJ SOLN
INTRAMUSCULAR | Status: DC | PRN
  Administered 2020-05-04: 100 ug via INTRAVENOUS

## 2020-05-04 MED ORDER — DEXAMETHASONE SODIUM PHOSPHATE 10 MG/ML IJ SOLN
INTRAMUSCULAR | Status: DC | PRN
  Administered 2020-05-04: 10 mg via INTRAVENOUS

## 2020-05-04 MED ORDER — MORPHINE SULFATE (PF) 4 MG/ML IV SOLN: 4.0000 mg | INTRAVENOUS | Status: DC | PRN

## 2020-05-04 MED ORDER — GABAPENTIN 300 MG PO CAPS
300.0000 mg | ORAL_CAPSULE | Freq: Two times a day (BID) | ORAL | Status: DC
  Administered 2020-05-04 – 2020-05-05 (×2): 300 mg via ORAL
  Filled 2020-05-04 (×2): qty 1

## 2020-05-04 MED ORDER — ONDANSETRON HCL 4 MG/2ML IJ SOLN: 4.0000 mg | Freq: Once | INTRAMUSCULAR | Status: AC | PRN

## 2020-05-04 MED ORDER — ONDANSETRON HCL 4 MG/2ML IJ SOLN
4.0000 mg | Freq: Once | INTRAMUSCULAR | Status: AC
  Administered 2020-05-04: 4 mg via INTRAVENOUS
  Filled 2020-05-04: qty 2

## 2020-05-04 MED ORDER — IOHEXOL 300 MG/ML  SOLN
100.0000 mL | Freq: Once | INTRAMUSCULAR | Status: AC | PRN
  Administered 2020-05-04: 100 mL via INTRAVENOUS
  Filled 2020-05-04: qty 100

## 2020-05-04 MED ORDER — ROCURONIUM BROMIDE 10 MG/ML (PF) SYRINGE
PREFILLED_SYRINGE | INTRAVENOUS | Status: AC
  Filled 2020-05-04: qty 10

## 2020-05-04 MED ORDER — ONDANSETRON HCL 4 MG/2ML IJ SOLN
INTRAMUSCULAR | Status: AC
  Administered 2020-05-04: 4 mg via INTRAVENOUS
  Filled 2020-05-04: qty 2

## 2020-05-04 MED ORDER — CELECOXIB 200 MG PO CAPS
200.0000 mg | ORAL_CAPSULE | Freq: Two times a day (BID) | ORAL | Status: DC
  Administered 2020-05-05: 200 mg via ORAL
  Filled 2020-05-04 (×3): qty 1

## 2020-05-04 MED ORDER — ROCURONIUM BROMIDE 100 MG/10ML IV SOLN
INTRAVENOUS | Status: DC | PRN
  Administered 2020-05-04: 40 mg via INTRAVENOUS

## 2020-05-04 MED ORDER — PHENYLEPHRINE HCL (PRESSORS) 10 MG/ML IV SOLN
INTRAVENOUS | Status: AC
  Filled 2020-05-04: qty 1

## 2020-05-04 MED ORDER — ACETAMINOPHEN 10 MG/ML IV SOLN
INTRAVENOUS | Status: AC
  Filled 2020-05-04: qty 100

## 2020-05-04 MED ORDER — SODIUM CHLORIDE 0.9 % IV SOLN: INTRAVENOUS | Status: DC

## 2020-05-04 MED ORDER — MIDAZOLAM HCL 2 MG/2ML IJ SOLN
INTRAMUSCULAR | Status: AC
  Filled 2020-05-04: qty 2

## 2020-05-04 MED ORDER — MORPHINE SULFATE (PF) 4 MG/ML IV SOLN
4.0000 mg | Freq: Once | INTRAVENOUS | Status: AC
Start: 2020-05-04 — End: 2020-05-04
  Administered 2020-05-04: 4 mg via INTRAVENOUS
  Filled 2020-05-04: qty 1

## 2020-05-04 MED ORDER — ACETAMINOPHEN 10 MG/ML IV SOLN
INTRAVENOUS | Status: DC | PRN
  Administered 2020-05-04: 1000 mg via INTRAVENOUS

## 2020-05-04 MED ORDER — KETOROLAC TROMETHAMINE 30 MG/ML IJ SOLN
30.0000 mg | Freq: Four times a day (QID) | INTRAMUSCULAR | Status: DC
  Administered 2020-05-04 – 2020-05-05 (×3): 30 mg via INTRAVENOUS
  Filled 2020-05-04 (×3): qty 1

## 2020-05-04 MED ORDER — OXYCODONE-ACETAMINOPHEN 5-325 MG PO TABS: 1.0000 | ORAL_TABLET | ORAL | 0 refills | Status: AC | PRN

## 2020-05-04 MED ORDER — LEVOTHYROXINE SODIUM 50 MCG PO TABS
75.0000 ug | ORAL_TABLET | Freq: Every day | ORAL | Status: DC
  Administered 2020-05-05: 75 ug via ORAL
  Filled 2020-05-04: qty 2

## 2020-05-04 MED ORDER — PIPERACILLIN-TAZOBACTAM 4.5 G IVPB
4.5000 g | Freq: Once | INTRAVENOUS | Status: AC
  Administered 2020-05-04: 4.5 g via INTRAVENOUS
  Filled 2020-05-04 (×3): qty 100

## 2020-05-04 MED ORDER — SUCCINYLCHOLINE CHLORIDE 200 MG/10ML IV SOSY
PREFILLED_SYRINGE | INTRAVENOUS | Status: AC
  Filled 2020-05-04: qty 10

## 2020-05-04 MED ORDER — ONDANSETRON HCL 4 MG/2ML IJ SOLN
INTRAMUSCULAR | Status: DC | PRN
  Administered 2020-05-04: 4 mg via INTRAVENOUS

## 2020-05-04 MED ORDER — PROPOFOL 10 MG/ML IV BOLUS
INTRAVENOUS | Status: DC | PRN
  Administered 2020-05-04: 130 mg via INTRAVENOUS

## 2020-05-04 MED ORDER — SUGAMMADEX SODIUM 200 MG/2ML IV SOLN
INTRAVENOUS | Status: DC | PRN
  Administered 2020-05-04: 200 mg via INTRAVENOUS

## 2020-05-04 MED ORDER — LACTATED RINGERS IV SOLN: INTRAVENOUS | Status: DC | PRN

## 2020-05-04 MED ORDER — SUCCINYLCHOLINE CHLORIDE 20 MG/ML IJ SOLN
INTRAMUSCULAR | Status: DC | PRN
  Administered 2020-05-04: 100 mg via INTRAVENOUS

## 2020-05-04 MED ORDER — DEXAMETHASONE SODIUM PHOSPHATE 10 MG/ML IJ SOLN
INTRAMUSCULAR | Status: AC
  Filled 2020-05-04: qty 1

## 2020-05-04 MED ORDER — ACETAMINOPHEN 10 MG/ML IV SOLN: 1000.0000 mg | Freq: Once | INTRAVENOUS | Status: DC | PRN

## 2020-05-04 MED ORDER — OXYCODONE-ACETAMINOPHEN 5-325 MG PO TABS
1.0000 | ORAL_TABLET | ORAL | Status: DC | PRN
  Administered 2020-05-04 – 2020-05-05 (×2): 1 via ORAL
  Filled 2020-05-04 (×2): qty 1

## 2020-05-04 MED ORDER — FENTANYL CITRATE (PF) 100 MCG/2ML IJ SOLN
25.0000 ug | INTRAMUSCULAR | Status: DC | PRN
Start: 2020-05-04 — End: 2020-05-04
  Administered 2020-05-04: 25 ug via INTRAVENOUS

## 2020-05-04 MED ORDER — PANTOPRAZOLE SODIUM 40 MG IV SOLR
40.0000 mg | Freq: Every day | INTRAVENOUS | Status: DC
  Administered 2020-05-04: 40 mg via INTRAVENOUS
  Filled 2020-05-04: qty 40

## 2020-05-04 MED ORDER — HYDROMORPHONE HCL 1 MG/ML IJ SOLN
0.5000 mg | Freq: Once | INTRAMUSCULAR | Status: AC
Start: 2020-05-04 — End: 2020-05-04
  Administered 2020-05-04: 0.5 mg via INTRAVENOUS
  Filled 2020-05-04: qty 1

## 2020-05-04 MED ORDER — ONDANSETRON 4 MG PO TBDP: 8.0000 mg | ORAL_TABLET | Freq: Three times a day (TID) | ORAL | Status: DC | PRN

## 2020-05-04 SURGICAL SUPPLY — 33 items
CHLORAPREP W/TINT 26 (MISCELLANEOUS) ×2 IMPLANT
COVER WAND RF STERILE (DRAPES) ×2 IMPLANT
CUTTER FLEX LINEAR 45M (STAPLE) ×2 IMPLANT
DEFOGGER SCOPE WARMER CLEARIFY (MISCELLANEOUS) ×2 IMPLANT
DERMABOND ADVANCED (GAUZE/BANDAGES/DRESSINGS) ×1
DERMABOND ADVANCED .7 DNX12 (GAUZE/BANDAGES/DRESSINGS) ×1 IMPLANT
ELECT REM PT RETURN 9FT ADLT (ELECTROSURGICAL) ×2
ELECTRODE REM PT RTRN 9FT ADLT (ELECTROSURGICAL) ×1 IMPLANT
GLOVE ORTHO TXT STRL SZ7.5 (GLOVE) ×2 IMPLANT
GOWN STRL REUS W/ TWL LRG LVL3 (GOWN DISPOSABLE) ×2 IMPLANT
GOWN STRL REUS W/TWL LRG LVL3 (GOWN DISPOSABLE) ×2
GRASPER SUT TROCAR 14GX15 (MISCELLANEOUS) IMPLANT
IRRIGATION STRYKERFLOW (MISCELLANEOUS) IMPLANT
IRRIGATOR STRYKERFLOW (MISCELLANEOUS)
IV NS IRRIG 3000ML ARTHROMATIC (IV SOLUTION) IMPLANT
KIT TURNOVER KIT A (KITS) ×2 IMPLANT
MANIFOLD NEPTUNE II (INSTRUMENTS) ×2 IMPLANT
NEEDLE HYPO 22GX1.5 SAFETY (NEEDLE) ×2 IMPLANT
NEEDLE INSUFFLATION 14GA 120MM (NEEDLE) IMPLANT
NS IRRIG 500ML POUR BTL (IV SOLUTION) ×2 IMPLANT
PACK LAP CHOLECYSTECTOMY (MISCELLANEOUS) ×2 IMPLANT
POUCH SPECIMEN RETRIEVAL 10MM (ENDOMECHANICALS) ×2 IMPLANT
RELOAD 45 VASCULAR/THIN (ENDOMECHANICALS) IMPLANT
SET TUBE SMOKE EVAC HIGH FLOW (TUBING) ×2 IMPLANT
SHEARS HARMONIC ACE PLUS 36CM (ENDOMECHANICALS) ×2 IMPLANT
SLEEVE ADV FIXATION 5X100MM (TROCAR) ×2 IMPLANT
SUT MNCRL 4-0 (SUTURE) ×1
SUT MNCRL 4-0 27XMFL (SUTURE) ×1
SUT VICRYL 0 AB UR-6 (SUTURE) IMPLANT
SUTURE MNCRL 4-0 27XMF (SUTURE) ×1 IMPLANT
SYS KII FIOS ACCESS ABD 5X100 (TROCAR) ×2
SYSTEM KII FIOS ACES ABD 5X100 (TROCAR) ×1 IMPLANT
TROCAR ADV FIXATION 12X100MM (TROCAR) ×2 IMPLANT

## 2020-05-04 NOTE — Progress Notes (Signed)
Preparing for possible d/h this evening sent post-op pain meds, to [facility] as listed first amongst her pharmacies.

## 2020-05-04 NOTE — ED Notes (Signed)
Patient gave her purse and smart watch to family member. Patient has her cell phone with her. Patient signed surgical consent.

## 2020-05-04 NOTE — Transfer of Care (Signed)
Immediate Anesthesia Transfer of Care Note  Patient: Patricia Ortega  Procedure(s) Performed: APPENDECTOMY LAPAROSCOPIC (N/A )  Patient Location: PACU  Anesthesia Type:General  Level of Consciousness: awake  Airway & Oxygen Therapy: Patient Spontanous Breathing and Patient connected to face mask oxygen  Post-op Assessment: Report given to RN  Post vital signs: stable  Last Vitals:  Vitals Value Taken Time  BP    Temp    Pulse 77 05/04/20 1929  Resp    SpO2 100 % 05/04/20 1929  Vitals shown include unvalidated device data.  Last Pain:  Vitals:   05/04/20 1802  TempSrc:   PainSc: 7          Complications: No complications documented.

## 2020-05-04 NOTE — Anesthesia Procedure Notes (Signed)
Procedure Name: Intubation Date/Time: 05/04/2020 6:40 PM Performed by: Joanette Gula, Jaielle Dlouhy, CRNA Pre-anesthesia Checklist: Patient identified, Emergency Drugs available, Suction available and Patient being monitored Patient Re-evaluated:Patient Re-evaluated prior to induction Oxygen Delivery Method: Circle system utilized Preoxygenation: Pre-oxygenation with 100% oxygen Induction Type: IV induction Laryngoscope Size: McGraph and 3 Grade View: Grade I Tube type: Oral Number of attempts: 1 Airway Equipment and Method: Stylet Placement Confirmation: ETT inserted through vocal cords under direct vision,  positive ETCO2 and breath sounds checked- equal and bilateral Secured at: 21 cm Tube secured with: Tape Dental Injury: Teeth and Oropharynx as per pre-operative assessment

## 2020-05-04 NOTE — Op Note (Signed)
Laparascopic appendectomy   Patricia Ortega Date of operation:  05/04/2020  Indications: The patient presented with a history of  abdominal pain. Workup has revealed findings consistent with acute appendicitis.  Pre-operative Diagnosis: Acute appendicitis without mention of peritonitis  Post-operative Diagnosis: Same  Surgeon: Campbell Lerner, M.D., FACS  Anesthesia: General with endotracheal tube  Findings: Swollen, mildly exudative acute appendicitis without evidence of rupture or gangrene.  Minimal cloudy pelvic fluid.  No evidence of peritonitis.  Estimated Blood Loss: 5 mL         Specimens: appendix         Complications: None  Procedure Details  The patient was seen again in the preop area. The options of surgery versus observation were reviewed with the patient and/or family. The risks of bleeding, infection, recurrence of symptoms, negative laparoscopy, potential for an open procedure, bowel injury, abscess or infection, were all reviewed as well. The patient was taken to Operating Room, identified as [place] and the procedure verified as laparoscopic appendectomy. A Time Out was held and the above information confirmed. The patient was placed in the supine position and general anesthesia was induced.  Antibiotic prophylaxis was administered pre-op and VT E prophylaxis was in place.   The abdomen was prepped and draped in a sterile fashion. Local infiltration with 0.25% Marcaine with epi is administered to all incisions.  An infraumbilical incision was made.  A towel grasper is applied for countertraction, and a 5 mm optical trocar was passed into the peritoneal cavity under direct visualization.  Pneumoperitoneum obtained. One 12 mm port in the LLQ, and another 5 mm port were placed under direct visualization.  The appendix was identified.  The appendix was carefully dissected. The base of the appendix was dissected out and divided with a 45 mm white  load Endo GIA.   The mesoappendix was divided with Harmonic scalpel. The appendix was placed in a Endo Catch bag and removed via the 12 mm LLQ port. The right lower quadrant and pelvis was then aspirated. Inspection  failed to identify any additional bleeding and there were no signs of bowel injury. Again the right lower quadrant was inspected there was no sign of bleeding or bowel injury. The LLQ fascial opening was staggered and of no significance, not requiring suture closure.  Pneumoperitoneum was released, all ports were removed, and the skin incisions were approximated with subcuticular 4-0 Monocryl. Dermabond was applied.  The patient tolerated the procedure well, there were no complications. The sponge lap and needle count were correct at the end of the procedure.  The patient was taken to the recovery room in stable condition to be discharged to home, probably in the morning, when appropriate.   Campbell Lerner, M.D., [facility] 05/04/2020 - 7:26 PM

## 2020-05-04 NOTE — ED Notes (Addendum)
Patient is resting in a darkened room. Patient denies relief of pain after morphine administration.

## 2020-05-04 NOTE — ED Notes (Signed)
Patient ambulated to and from  Hallway bathroom with a steady gait.

## 2020-05-04 NOTE — ED Notes (Signed)
Patient transferred to OR

## 2020-05-04 NOTE — ED Provider Notes (Signed)
[facility] Emergency Department Provider Note   ____________________________________________   Event Date/Time   First MD Initiated Contact with Patient 05/04/20 1323     (approximate)  I have reviewed the triage vital signs and the nursing notes.   HISTORY  Chief Complaint Abdominal Pain    HPI Patricia Ortega is a 45 y.o. female with no significant past medical history who presents to the ED complaining of abdominal pain.  Patient reports that she was woken from sleep with pain in her abdomen around 2:00 this morning.  Pain is described as sharp and constant, not exacerbated or alleviated by anything.  It has been gradually worsening since onset, she tried taking some over-the-counter antacids with no relief.  Pain has been associated with nausea and chills, but she has not vomited and denies any changes in her bowel movements.  She denies any dysuria, hematuria, flank pain, fever, chest pain, or shortness of breath.  She has no abdominal surgical history, denies similar symptoms in the past.        Past Medical History:  Diagnosis Date  . Medical history non-contributory     Patient Active Problem List   Diagnosis Date Noted  . SVD (spontaneous vaginal delivery) 11/08/2014  . Indication for care in labor or delivery 11/07/2014    Past Surgical History:  Procedure Laterality Date  . RECTAL SURGERY     as a child had repair due to injury  . WISDOM TOOTH EXTRACTION Bilateral 1997    Prior to Admission medications   Medication Sig Start Date End Date Taking? Authorizing Provider  Aspirin-Acetaminophen-Caffeine (EXCEDRIN PO) Take by mouth. PRN For h/a    [provider]  cetirizine (ZYRTEC) 10 MG tablet Take 10 mg by mouth daily.    [provider]  LEXAPRO 5 MG tablet  05/29/19   [provider]  NON FORMULARY Essential oil vitamin ( Tri Ease)    [provider]  Probiotic Product (PROBIOTIC PO)  Take by mouth.    [provider]  Ubrogepant (UBRELVY) 50 MG TABS Take 50 mg by mouth as needed (may repeat once in 2 hours. no more than 2 pills in 24 h.). 03/06/19   Huston Foley, MD    Allergies Patient has no known allergies.  Family History  Problem Relation Age of Onset  . Thyroid disease Mother   . Diabetes Father   . Heart disease Father 20       Stents, died age 29  . Emphysema Paternal Grandmother     Social History Social History   Tobacco Use  . Smoking status: Never Smoker  . Smokeless tobacco: Never Used  Substance Use Topics  . Alcohol use: Yes    Alcohol/week: 5.0 standard drinks    Types: 5 Standard drinks or equivalent per week  . Drug use: No    Review of Systems  Constitutional: No fever/chills Eyes: No visual changes. ENT: No sore throat. Cardiovascular: Denies chest pain. Respiratory: Denies shortness of breath. Gastrointestinal: Positive for abdominal pain and nausea, no vomiting.  No diarrhea.  No constipation. Genitourinary: Negative for dysuria. Musculoskeletal: Negative for back pain. Skin: Negative for rash. Neurological: Negative for headaches, focal weakness or numbness.  ____________________________________________   PHYSICAL EXAM:  VITAL SIGNS: ED Triage Vitals  Enc Vitals Group     BP 05/04/20 0918 109/73     Pulse Rate 05/04/20 0918 61     Resp 05/04/20 0918 16     Temp  05/04/20 0918 97.7 F (36.5 C)     Temp Source 05/04/20 0918 Oral     SpO2 05/04/20 0918 100 %     Weight 05/04/20 0920 165 lb (74.8 kg)     Height 05/04/20 0920 5\' 8"  (1.727 m)     Head Circumference --      Peak Flow --      Pain Score 05/04/20 0919 9     Pain Loc --      Pain Edu? --      Excl. in GC? --     Constitutional: Alert and oriented. Eyes: Conjunctivae are normal. Head: Atraumatic. Nose: No congestion/rhinnorhea. Mouth/Throat: Mucous membranes are moist. Neck: Normal ROM Cardiovascular: Normal rate, regular rhythm. Grossly  normal heart sounds. Respiratory: Normal respiratory effort.  No retractions. Lungs CTAB. Gastrointestinal: Soft and diffusely tender to palpation, greatest in the right lower quadrant. No distention. Genitourinary: deferred Musculoskeletal: No lower extremity tenderness nor edema. Neurologic:  Normal speech and language. No gross focal neurologic deficits are appreciated. Skin:  Skin is warm, dry and intact. No rash noted. Psychiatric: Mood and affect are normal. Speech and behavior are normal.  ____________________________________________   LABS (all labs ordered are listed, but only abnormal results are displayed)  Labs Reviewed  COMPREHENSIVE METABOLIC PANEL - Abnormal; Notable for the following components:      Result Value   Glucose, Bld 141 (*)    Alkaline Phosphatase 36 (*)    All other components within normal limits  CBC - Abnormal; Notable for the following components:   RDW 11.4 (*)    All other components within normal limits  URINALYSIS, COMPLETE (UACMP) WITH MICROSCOPIC - Abnormal; Notable for the following components:   Color, Urine YELLOW (*)    APPearance HAZY (*)    Hgb urine dipstick SMALL (*)    Ketones, ur 80 (*)    All other components within normal limits  RESP PANEL BY RT-PCR (FLU A&B, COVID) ARPGX2  LIPASE, BLOOD  POC URINE PREG, ED   ____________________________________________  EKG  ED ECG REPORT I, 05/06/20, the attending physician, personally viewed and interpreted this ECG.   Date: 05/04/2020  EKG Time: 9:34  Rate: 65  Rhythm: normal EKG, normal sinus rhythm, unchanged from previous tracings  Axis: Normal  Intervals:none  ST&T Change: None   PROCEDURES  Procedure(s) performed (including Critical Care):  Procedures   ____________________________________________   INITIAL IMPRESSION / ASSESSMENT AND PLAN / ED COURSE       45 year old female with no significant past medical history presents to the ED with gradually  worsening abdominal pain since 2:00 this morning.  Pain does seem to be the worst in her right lower quadrant and we will further assess with CT scan for appendicitis.  Labs thus far are reassuring, LFTs and lipase within normal limits.  EKG shows no evidence of arrhythmia or ischemia and I doubt cardiac etiology.  Pregnancy testing and UA are pending.  Patient reports pain is recurring despite initial dose of morphine and Zofran, we will give an additional 4 mg of IV morphine, hydrate with IV fluids.  CT scan is remarkable for uncomplicated appendicitis, we will give dose of Zosyn and patient reports recurring pain, which we will treat with IV Dilaudid.  Case discussed with Dr. 54 of general surgery, who accepts patient for admission.      ____________________________________________   FINAL CLINICAL IMPRESSION(S) / ED DIAGNOSES  Final diagnoses:  Appendicitis, unspecified appendicitis type  ED Discharge Orders    None       Note:  This document was prepared using Dragon voice recognition software and may include unintentional dictation errors.   Chesley Noon, MD 05/04/20 1535

## 2020-05-04 NOTE — ED Notes (Addendum)
Report given to [facility] in [place]. Dr. Claudine Mouton at bedside.

## 2020-05-04 NOTE — Anesthesia Preprocedure Evaluation (Signed)
Anesthesia Evaluation  Patient identified by MRN, date of birth, ID band Patient awake  General Assessment Comment:Acute appendicitis  Reviewed: Allergy & Precautions, NPO status , Patient's Chart, lab work & pertinent test results  History of Anesthesia Complications Negative for: history of anesthetic complications  Airway Mallampati: III  TM Distance: >3 FB Neck ROM: Full    Dental no notable dental hx. (+) Teeth Intact   Pulmonary neg pulmonary ROS, neg sleep apnea, neg COPD, Patient abstained from smoking.Not current smoker,    Pulmonary exam normal breath sounds clear to auscultation       Cardiovascular Exercise Tolerance: Good METS(-) hypertension(-) CAD and (-) Past MI negative cardio ROS  (-) dysrhythmias  Rhythm:Regular Rate:Normal - Systolic murmurs    Neuro/Psych PSYCHIATRIC DISORDERS Depression negative neurological ROS     GI/Hepatic neg GERD  ,(+)     (-) substance abuse  ,   Endo/Other  neg diabetesHypothyroidism   Renal/GU negative Renal ROS     Musculoskeletal   Abdominal   Peds  Hematology   Anesthesia Other Findings Past Medical History: No date: Medical history non-contributory  Reproductive/Obstetrics                             Anesthesia Physical Anesthesia Plan  ASA: II  Anesthesia Plan: General   Post-op Pain Management:    Induction: Intravenous and Rapid sequence  PONV Risk Score and Plan: 4 or greater and Ondansetron, Dexamethasone, Midazolam and Treatment may vary due to age or medical condition  Airway Management Planned: Oral ETT  Additional Equipment: None  Intra-op Plan:   Post-operative Plan: Extubation in OR  Informed Consent: I have reviewed the patients History and Physical, chart, labs and discussed the procedure including the risks, benefits and alternatives for the proposed anesthesia with the patient or authorized representative  who has indicated his/her understanding and acceptance.     Dental advisory given  Plan Discussed with: CRNA and Surgeon  Anesthesia Plan Comments: (Discussed risks of anesthesia with patient, including PONV, sore throat, lip/dental damage. Rare risks discussed as well, such as cardiorespiratory and neurological sequelae. Patient understands.)        Anesthesia Quick Evaluation

## 2020-05-04 NOTE — H&P (Signed)
Patient ID: Patricia Ortega, female   DOB: 01/06/1976, 45 y.o.   MRN: [id number]  Chief Complaint: Abdominal pain  History of Present Illness Patricia Ortega is a 45 y.o. female with acute onset abdominal pain, awakening patient from sleep last night.  She reports the pain is localized to the right mid abdomen, has progressed in intensity, does not appear to be alleviated by anything, including than her recent narcotics given.  Patient endorses nausea and sense of chills.  She denies any recent changes in bowel activity and denies vomiting.  No prior abdominal surgical history, no similar pain syndrome in the past.   Past Medical History Past Medical History:  Diagnosis Date  . Medical history non-contributory       Past Surgical History:  Procedure Laterality Date  . RECTAL SURGERY     as a child had repair due to injury  . WISDOM TOOTH EXTRACTION Bilateral 1997    No Known Allergies  Current Facility-Administered Medications  Medication Dose Route Frequency Provider Last Rate Last Admin  . piperacillin-tazobactam (ZOSYN) IVPB 4.5 g  4.5 g Intravenous Once Chesley Noon, MD       Current Outpatient Medications  Medication Sig Dispense Refill  . Aspirin-Acetaminophen-Caffeine (EXCEDRIN PO) Take by mouth. PRN For h/a    . cetirizine (ZYRTEC) 10 MG tablet Take 10 mg by mouth daily.    Marland Kitchen LEXAPRO 5 MG tablet     . NON FORMULARY Essential oil vitamin ( Tri Ease)    . Probiotic Product (PROBIOTIC PO) Take by mouth.    . Ubrogepant (UBRELVY) 50 MG TABS Take 50 mg by mouth as needed (may repeat once in 2 hours. no more than 2 pills in 24 h.). 10 tablet 3    Family History Family History  Problem Relation Age of Onset  . Thyroid disease Mother   . Diabetes Father   . Heart disease Father 41       Stents, died age 29  . Emphysema Paternal Grandmother       Social History Social History   Tobacco Use  . Smoking status: Never Smoker  . Smokeless tobacco:  Never Used  Substance Use Topics  . Alcohol use: Yes    Alcohol/week: 5.0 standard drinks    Types: 5 Standard drinks or equivalent per week  . Drug use: No        Review of Systems  Constitutional: Negative for fever.  HENT: Negative for hearing loss.   Eyes: Negative for pain.  Respiratory: Negative for shortness of breath.   Cardiovascular: Negative for chest pain.  Gastrointestinal: Negative for vomiting.  Genitourinary: Negative for dysuria.  Musculoskeletal: Negative for myalgias.  Skin: Negative for rash.  Neurological: Negative for headaches.      Physical Exam Blood pressure 108/69, pulse 70, temperature 97.7 F (36.5 C), temperature source Oral, resp. rate 18, height 5\' 8"  (1.727 m), weight 74.8 kg, SpO2 100 %, unknown if currently breastfeeding. Last Weight  Most recent update: 05/04/2020  9:20 AM   Weight  74.8 kg (165 lb)            CONSTITUTIONAL: Well developed, and nourished, appropriately responsive and aware without distress.   EYES: Sclera non-icteric.   EARS, NOSE, MOUTH AND THROAT: Mask worn.   Hearing is intact to voice.  NECK: Trachea is midline, and there is no jugular venous distension.  LYMPH NODES:  Lymph nodes in the neck are not enlarged. RESPIRATORY:  Lungs are clear, and breath  sounds are equal bilaterally. Normal respiratory effort without pathologic use of accessory muscles. CARDIOVASCULAR: Heart is regular in rate and rhythm. GI: The abdomen is tender in the right lower quadrant with voluntary guarding, otherwise soft, nontender, and nondistended. There were no palpable masses. I did not appreciate hepatosplenomegaly.   MUSCULOSKELETAL:  Symmetrical muscle tone appreciated in all four extremities.    SKIN: Skin turgor is normal. No pathologic skin lesions appreciated.  NEUROLOGIC:  Motor and sensation appear grossly normal.  Cranial nerves are grossly without defect. PSYCH:  Alert and oriented to person, place and time. Affect is  appropriate for situation.  Data Reviewed I have personally reviewed what is currently available of the patient's imaging, recent labs and medical records.   Labs:  CBC Latest Ref Rng & Units 05/04/2020 02/27/2019 11/09/2014  WBC 4.0 - 10.5 K/uL 10.0 5.1 11.1(H)  Hemoglobin 12.0 - 15.0 g/dL 76.1 95.0 9.3(O)  Hematocrit 36.0 - 46.0 % 37.8 38.4 27.5(L)  Platelets 150 - 400 K/uL 231 192 136(L)   CMP Latest Ref Rng & Units 05/04/2020 02/27/2019  Glucose 70 - 99 mg/dL 671(I) 458(K)  BUN 6 - 20 mg/dL 14 11  Creatinine 9.98 - 1.00 mg/dL 3.38 2.50  Sodium 539 - 145 mmol/L 135 136  Potassium 3.5 - 5.1 mmol/L 4.1 3.6  Chloride 98 - 111 mmol/L 101 102  CO2 22 - 32 mmol/L 24 21(L)  Calcium 8.9 - 10.3 mg/dL 9.3 9.3  Total Protein 6.5 - 8.1 g/dL 7.5 -  Total Bilirubin 0.3 - 1.2 mg/dL 0.8 -  Alkaline Phos 38 - 126 U/L 36(L) -  AST 15 - 41 U/L 26 -  ALT 0 - 44 U/L 29 -      Imaging:  Within last 24 hrs: CT Abdomen Pelvis W Contrast  Result Date: 05/04/2020 CLINICAL DATA:  Patient c/o epigastric pain that began at 2am this morning. Patient also c/o nausea and chills. EXAM: CT ABDOMEN AND PELVIS WITH CONTRAST TECHNIQUE: Multidetector CT imaging of the abdomen and pelvis was performed using the standard protocol following bolus administration of intravenous contrast. CONTRAST:  OMNIPAQUE IOHEXOL 300 MG/ML  SOLN COMPARISON:  None. FINDINGS: Lower chest: No acute findings. Hepatobiliary: No focal liver abnormality is seen. No gallstones, gallbladder wall thickening, or biliary dilatation. Pancreas: Unremarkable. No pancreatic ductal dilatation or surrounding inflammatory changes. Spleen: Normal in size without focal abnormality. Adrenals/Urinary Tract: Adrenal glands are unremarkable. Kidneys are normal, without renal calculi, focal lesion, or hydronephrosis. Bladder is unremarkable. Stomach/Bowel: Stomach is within normal limits. The appendix is dilated measuring up to 1.4 cm in diameter with mucosal  hyperemia and multiple appendicoliths. There is surrounding fat stranding. There is no adjacent fluid collection or evidence of free air. The bowel is unremarkable. No evidence obstruction. Vascular/Lymphatic: No significant vascular findings are present. No enlarged abdominal or pelvic lymph nodes. Reproductive: IUD in place in the uterus. Bilateral adnexa are unremarkable. Other: Small volume free fluid in the pelvis, likely reactive. Musculoskeletal: No acute or significant osseous findings. IMPRESSION: Acute uncomplicated appendicitis. Electronically Signed   By: Emmaline Kluver M.D.   On: 05/04/2020 15:15    Assessment    Acute appendicitis Patient Active Problem List   Diagnosis Date Noted  . SVD (spontaneous vaginal delivery) 11/08/2014  . Indication for care in labor or delivery 11/07/2014    Plan    Laparoscopic appendectomy.  The risks, benefits, complications, treatment options, and expected outcomes were discussed with the patient. The treatment of antibiotics alone was  discussed giving a 20% chance that this could fail and surgery would be necessary.  Also discussed continuing to the operating room for Laparoscopic Appendectomy.  The possibilities of  bleeding, recurrent infection, perforation of viscus, finding a normal appendix, the need for additional procedures, failure to diagnose a condition, conversion to open procedure and creating a complication requiring transfusion or further operations were discussed. The patient was given the opportunity to ask questions and have them answered.  Patient would like to proceed with Laparoscopic Appendectomy and consent was obtained.  CBC Latest Ref Rng & Units 05/04/2020 02/27/2019 11/09/2014  WBC 4.0 - 10.5 K/uL 10.0 5.1 11.1(H)  Hemoglobin 12.0 - 15.0 g/dL 33.0 07.6 2.2(Q)  Hematocrit 36.0 - 46.0 % 37.8 38.4 27.5(L)  Platelets 150 - 400 K/uL 231 192 136(L)    Face-to-face time spent with the patient and accompanying care providers(if  present) was 30 minutes, with more than 50% of the time spent counseling, educating, and coordinating care of the patient.      Campbell Lerner M.D., FACS 05/04/2020, 3:37 PM

## 2020-05-04 NOTE — ED Triage Notes (Addendum)
Patient c/o epigastric pain that began at 2am this morning. Patient also c/o nausea and chills. Patient was sent over from [facility]. Patient reports taking Tagament and (2) 4mg  Zofran without relief.

## 2020-05-05 MED ORDER — IBUPROFEN 800 MG PO TABS: 800.0000 mg | ORAL_TABLET | Freq: Three times a day (TID) | ORAL | 0 refills | Status: AC | PRN

## 2020-05-05 NOTE — Anesthesia Postprocedure Evaluation (Signed)
Anesthesia Post Note  Patient: Patricia Ortega, Patricia Ortega  Procedure(s) Performed: APPENDECTOMY LAPAROSCOPIC (N/A )  Patient location during evaluation: PACU Anesthesia Type: General Level of consciousness: awake and alert Pain management: pain level controlled Vital Signs Assessment: post-procedure vital signs reviewed and stable Respiratory status: spontaneous breathing, nonlabored ventilation, respiratory function stable and patient connected to nasal cannula oxygen Cardiovascular status: blood pressure returned to baseline and stable Postop Assessment: no apparent nausea or vomiting Anesthetic complications: no   No complications documented.   Last Vitals:  Vitals:   05/04/20 2100 05/05/20 0619  BP: 113/67 (!) 93/58  Pulse: 78 65  Resp: 15 16  Temp: 36.9 C 36.9 C  SpO2: 97% 98%    Last Pain:  Vitals:   05/05/20 1610  TempSrc: Oral  PainSc:                  Corinda Gubler

## 2020-05-05 NOTE — Discharge Summary (Signed)
Physician Discharge Summary  Patient ID: Patricia Ortega MRN: [id number] DOB/AGE: 08/05/1975 45 y.o.  Admit date: 05/04/2020 Discharge date: 05/05/2020  Admission Diagnoses:  Discharge Diagnoses:  Active Problems:   Surgery, elective   Acute appendicitis   Discharged Condition: good  Hospital Course: Late case prior evening, uneventful postop course tolerating clear liquids.  Complains of right shoulder pain utilizing heat pack.  Vital signs stable.  Consults: None  Significant Diagnostic Studies: radiology: CT scan: Per report, done in ED confirming diagnosis of appendicitis.  Treatments: surgery: Laparoscopic appendectomy.  Discharge Exam: Blood pressure 97/64, pulse 73, temperature 98 F (36.7 C), temperature source Oral, resp. rate 18, height 5\' 8"  (1.727 m), weight 74.8 kg, SpO2 99 %, unknown if currently breastfeeding. GI: soft, non-tender; bowel sounds normal; no masses,  no organomegaly.  Incision is clean dry and intact.  Disposition: Discharge disposition: 01-Home or Self Care       Discharge Instructions    Call MD for:  persistant nausea and vomiting   Complete by: As directed    Call MD for:  redness, tenderness, or signs of infection (pain, swelling, redness, odor or green/yellow discharge around incision site)   Complete by: As directed    Call MD for:  severe uncontrolled pain   Complete by: As directed    Diet - low sodium heart healthy   Complete by: As directed    Discharge wound care:   Complete by: As directed    Your incision was closed with Dermabond.  It is best to keep it clean and dry, it will tolerate a brief shower, but do not soak it or apply any creams or lotions to the incisions.  The Dermabond should gradually flake off over time.  Keep it open to air so you can evaluate your incisions.  Dermabond assists the underlying sutures to keep your incision closed and protected from infection.  Should you develop some drainage from your  incision, some drops of drainage would be okay but if it persists continue to put keep a dry dressing over it.   Driving Restrictions   Complete by: As directed    No driving until cleared after follow-up appointment.  Is not advised to drive while taking narcotic pain medications or in significant pain.   Increase activity slowly   Complete by: As directed    Lifting restrictions   Complete by: As directed    Strongly advised against any form of lifting greater than 15 pounds over the next 4 to 6 weeks.  This involves pushing/pulling movements as well.  After 4 weeks when may gradually engage in more activities remaining aware of any new pain/tenderness elicited, and avoiding those for the full duration of 6 weeks.  Walking is encouraged.  Climbing stairs with caution.     Allergies as of 05/05/2020   No Known Allergies     Medication List    TAKE these medications   cetirizine 10 MG tablet Commonly known as: ZYRTEC Take 10 mg by mouth daily.   escitalopram 10 MG tablet Commonly known as: LEXAPRO Take 10 mg by mouth daily.   ibuprofen 800 MG tablet Commonly known as: ADVIL Take 1 tablet (800 mg total) by mouth every 8 (eight) hours as needed.   levothyroxine 75 MCG tablet Commonly known as: SYNTHROID Take 75 mcg by mouth daily.   ondansetron 8 MG disintegrating tablet Commonly known as: ZOFRAN-ODT Take 8 mg by mouth every 8 (eight) hours as needed for nausea or  vomiting.   oxyCODONE-acetaminophen 5-325 MG tablet Commonly known as: Percocet Take 1-2 tablets by mouth every 4 (four) hours as needed for severe pain.   PROBIOTIC PO Take 1 capsule by mouth daily.            Discharge Care Instructions  (From admission, onward)         Start     Ordered   05/05/20 0000  Discharge wound care:       Comments: Your incision was closed with Dermabond.  It is best to keep it clean and dry, it will tolerate a brief shower, but do not soak it or apply any creams or lotions  to the incisions.  The Dermabond should gradually flake off over time.  Keep it open to air so you can evaluate your incisions.  Dermabond assists the underlying sutures to keep your incision closed and protected from infection.  Should you develop some drainage from your incision, some drops of drainage would be okay but if it persists continue to put keep a dry dressing over it.   05/05/20 1511          Follow-up Information    Campbell Lerner, MD. Schedule an appointment as soon as possible for a visit on 05/14/2020.   Specialty: General Surgery Contact information: [address] [place] [address] [phone]               Signed: Campbell Lerner, M.D., [facility] 05/05/2020, 3:11 PM

## 2020-05-06 ENCOUNTER — Encounter: Payer: Self-pay | Admitting: Surgery

## 2020-05-07 LAB — SURGICAL PATHOLOGY

## 2020-05-14 ENCOUNTER — Other Ambulatory Visit: Payer: Self-pay

## 2020-05-14 ENCOUNTER — Encounter: Payer: Self-pay | Admitting: Surgery

## 2020-05-14 ENCOUNTER — Ambulatory Visit (INDEPENDENT_AMBULATORY_CARE_PROVIDER_SITE_OTHER): Payer: Managed Care, Other (non HMO) | Admitting: Surgery

## 2020-05-14 VITALS — BP 99/59 | HR 74 | Temp 98.6°F | Ht 68.0 in | Wt 161.2 lb

## 2020-05-14 DIAGNOSIS — Z9049 Acquired absence of other specified parts of digestive tract: Secondary | ICD-10-CM | POA: Insufficient documentation

## 2020-05-14 NOTE — Progress Notes (Signed)
[facility] POST-OP OFFICE VISIT  05/14/2020  HPI: Patricia Ortega is a 45 y.o. female 10 days s/p laparoscopic appendectomy, for uncomplicated appendicitis.  She reports she still utilizes a heating pad for your right shoulder, advised that the pneumoperitoneal gas is long gone should be resolving.  She reports her bellybutton is still sensitive and is avoiding any kind of but not pants.  Has had no wound discharge, reports normal bowel activities and urination.  She denies any fevers, chills, nausea, vomiting.  Vital signs: BP (!) 99/59   Pulse 74   Temp 98.6 F (37 C) (Oral)   Ht 5\' 8"  (1.727 m)   Wt 161 lb 3.2 oz (73.1 kg)   SpO2 97%   BMI 24.51 kg/m    Physical Exam: Constitutional: She appears well. Abdomen: Soft nontender.  Ecchymosis about the suprapubic incision, no mass. Skin: All incisions are clean dry and intact, Dermabond is flaking like dry skin.  Assessment/Plan: This is a 45 y.o. female 10 days s/p laparoscopic appendectomy, doing well.  Patient Active Problem List   Diagnosis Date Noted  . Surgery, elective 05/04/2020  . Acute appendicitis 05/04/2020  . SVD (spontaneous vaginal delivery) 11/08/2014  . Indication for care in labor or delivery 11/07/2014    -We discussed easing into the types of nontraumatic exercise, and a full release to all exercise in a month.  We will be glad to see her back as needed.   11/09/2014 M.D., FACS 05/14/2020, 10:14 AM

## 2020-05-14 NOTE — Patient Instructions (Addendum)
You may begin exercising but try your best avoid baring down or lifting. Continue allowing warm soapy water to flow over incisions. Please give [place] a call if you have any questions or concerns.

## 2020-12-08 IMAGING — CT CT HEAD W/O CM
4 series · 16 of 47 positions shown, 18 images · non-contrast
Comparison: None.

CLINICAL DATA: Headache with left-sided facial numbness

EXAM:
CT HEAD WITHOUT CONTRAST
TECHNIQUE: Contiguous axial images were obtained from the base of the skull
through the vertex without intravenous contrast.

[Series 3: head without · axial · non-contrast · 0.43mm/px · z∈[-97,+18]mm · 7 of 31 slices shown, 9 images]
[im 4/31  brain]
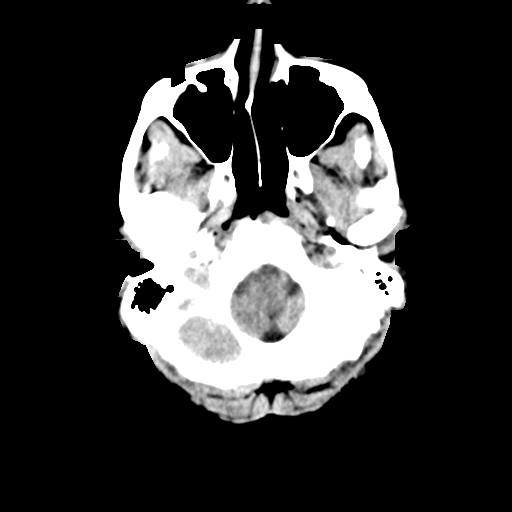
[im 4/31  bone]
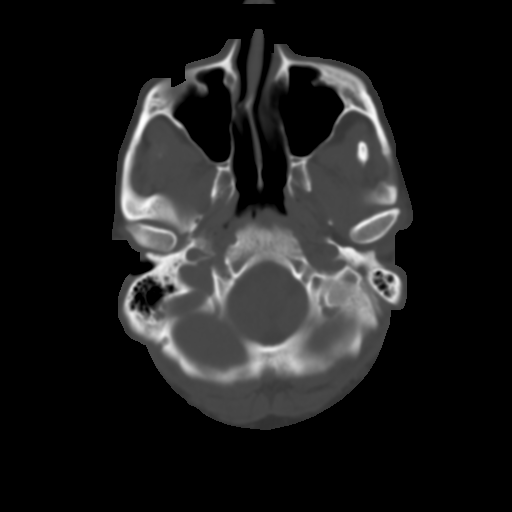
[im 8/31  brain]
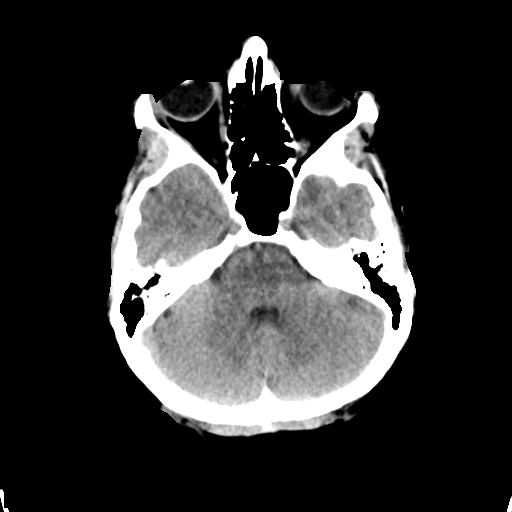
[im 12/31  brain]
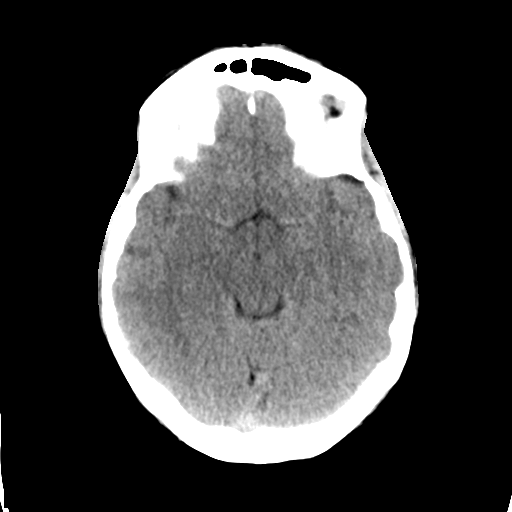
[im 16/31  brain]
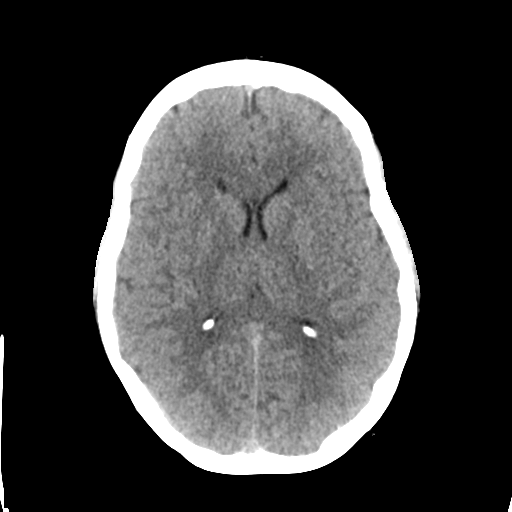
[im 19/31  brain]
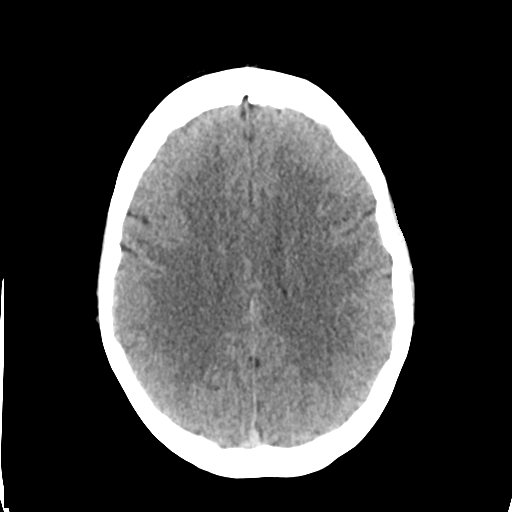
[im 19/31  bone]
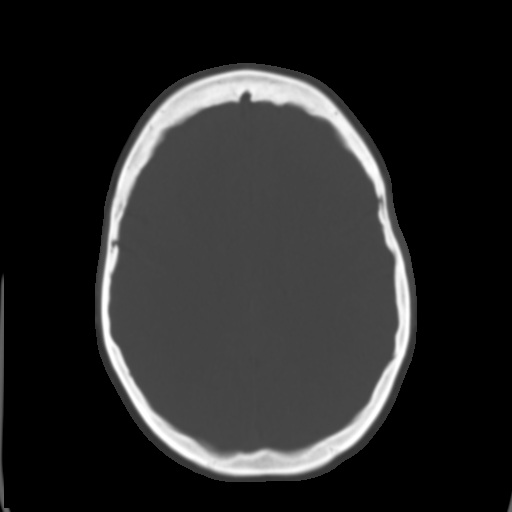
[im 23/31  brain]
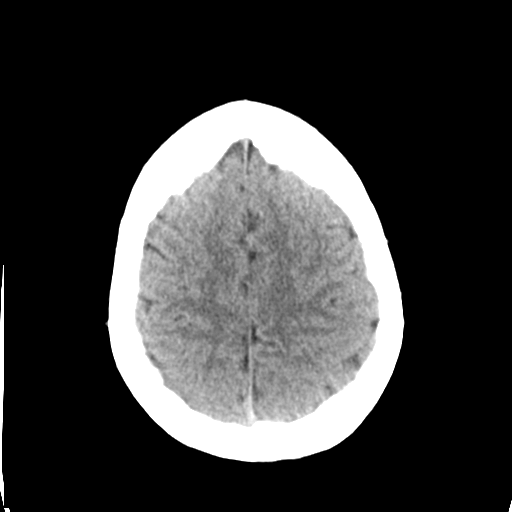
[im 27/31  brain]
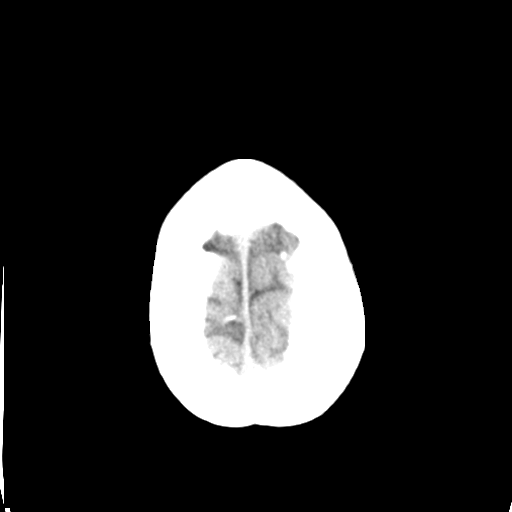

[Series 4: head bone · axial · 0.43mm/px · z∈[-98,-68]mm · 3 of 76 slices shown]
[im 8/76  bone]
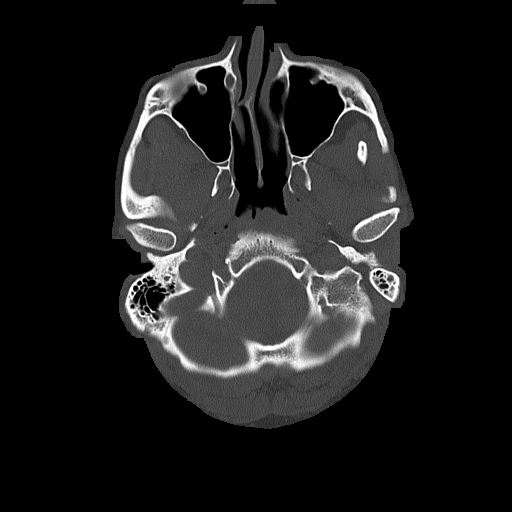
[im 16/76  bone]
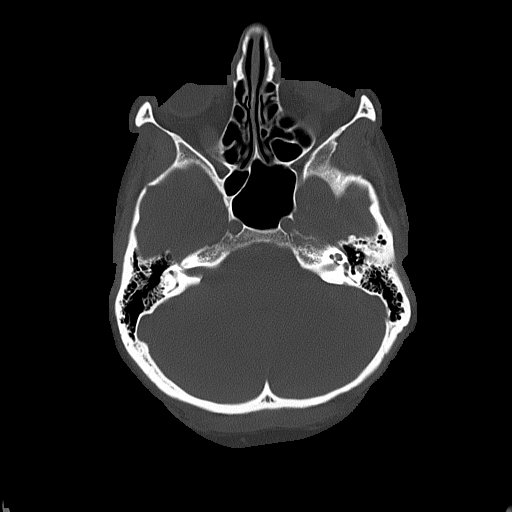
[im 23/76  bone]
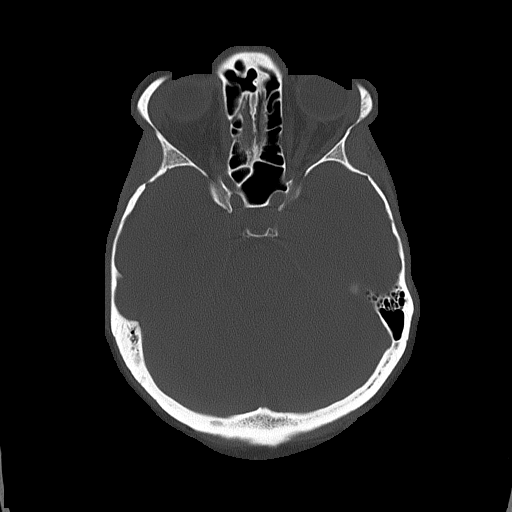

[Series 5: head without cor · coronal · non-contrast · 0.33mm/px · 3 of 65 slices shown]
[im 22/65  brain]
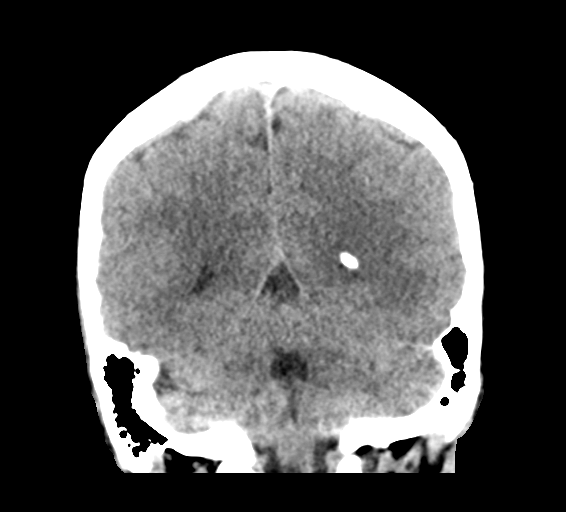
[im 29/65  brain]
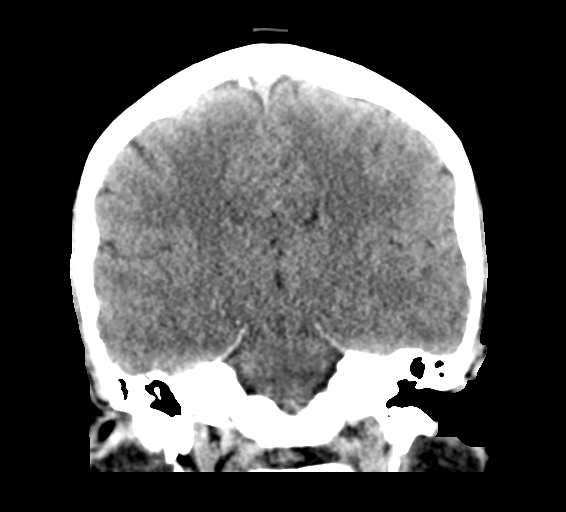
[im 36/65  brain]
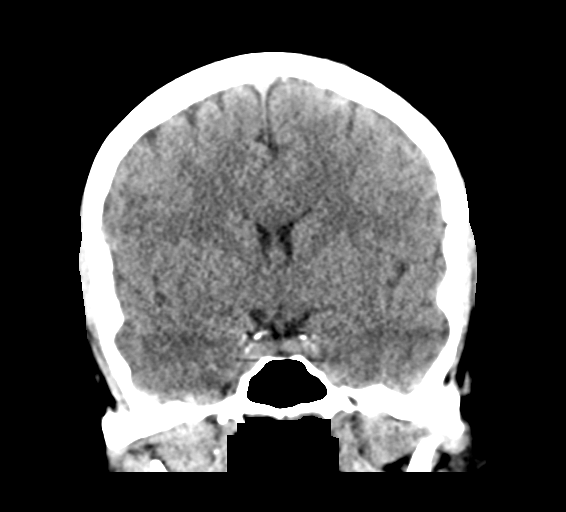

[Series 6: head without sag · sagittal · non-contrast · 0.32mm/px · 3 of 49 slices shown]
[im 17/49  brain]
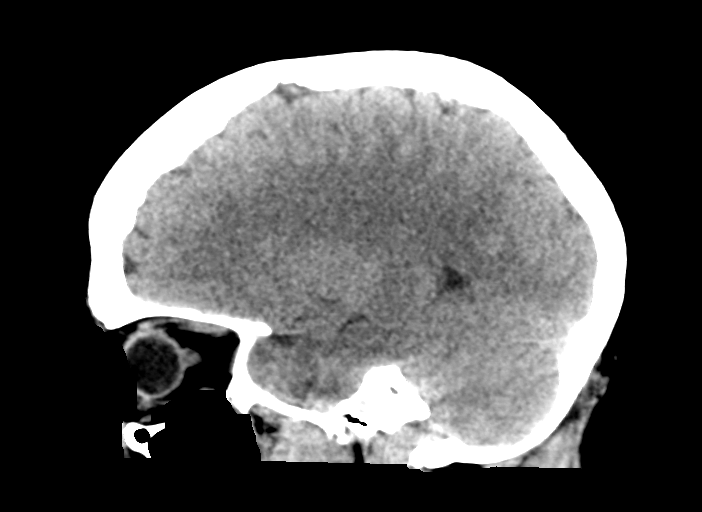
[im 25/49  brain]
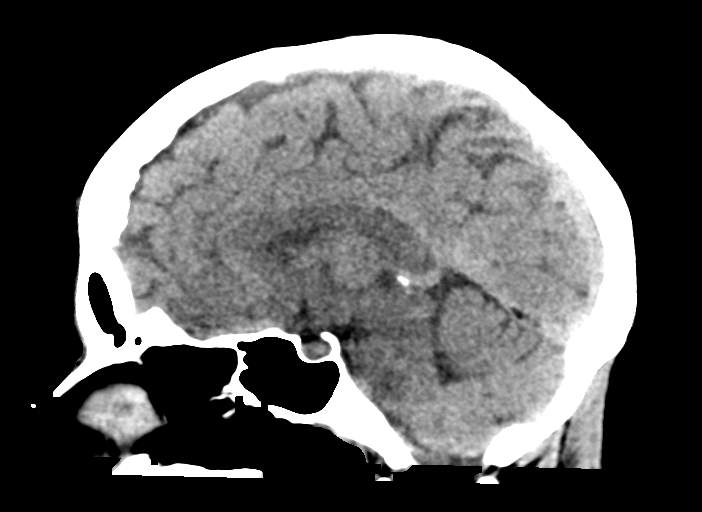
[im 33/49  brain]
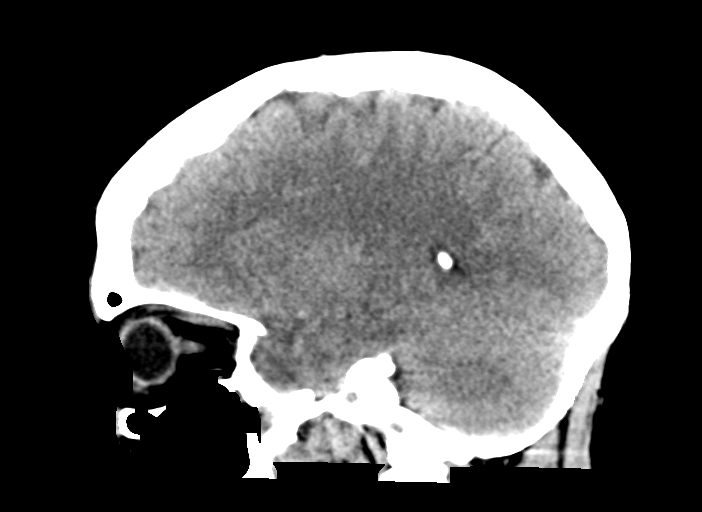

[16 of 47 positions shown; findings below may reference images not displayed]

FINDINGS: Brain: The ventricles are normal in size and configuration. There is
no intracranial mass, hemorrhage, extra-axial fluid collection, or
midline shift. The brain parenchyma appears unremarkable. No evident
acute infarct.

Vascular: No hyperdense vessel.  No evident vascular calcification.

Skull: The bony calvarium appears intact.

Sinuses/Orbits: There is mild mucosal thickening in several ethmoid
air cells. Other visualized paranasal sinuses are clear. Orbits
appear symmetric bilaterally.

Other: Mastoid air cells are clear.
IMPRESSION: Mild mucosal thickening in several ethmoid air cells. Study
otherwise unremarkable.4

## 2020-12-08 IMAGING — CR DG CHEST 2V
2 series · 2 of 2 positions shown · non-contrast
Comparison: None.

CLINICAL DATA: Left-sided chest pain

EXAM:
CHEST - 2 VIEW

[chest pa]
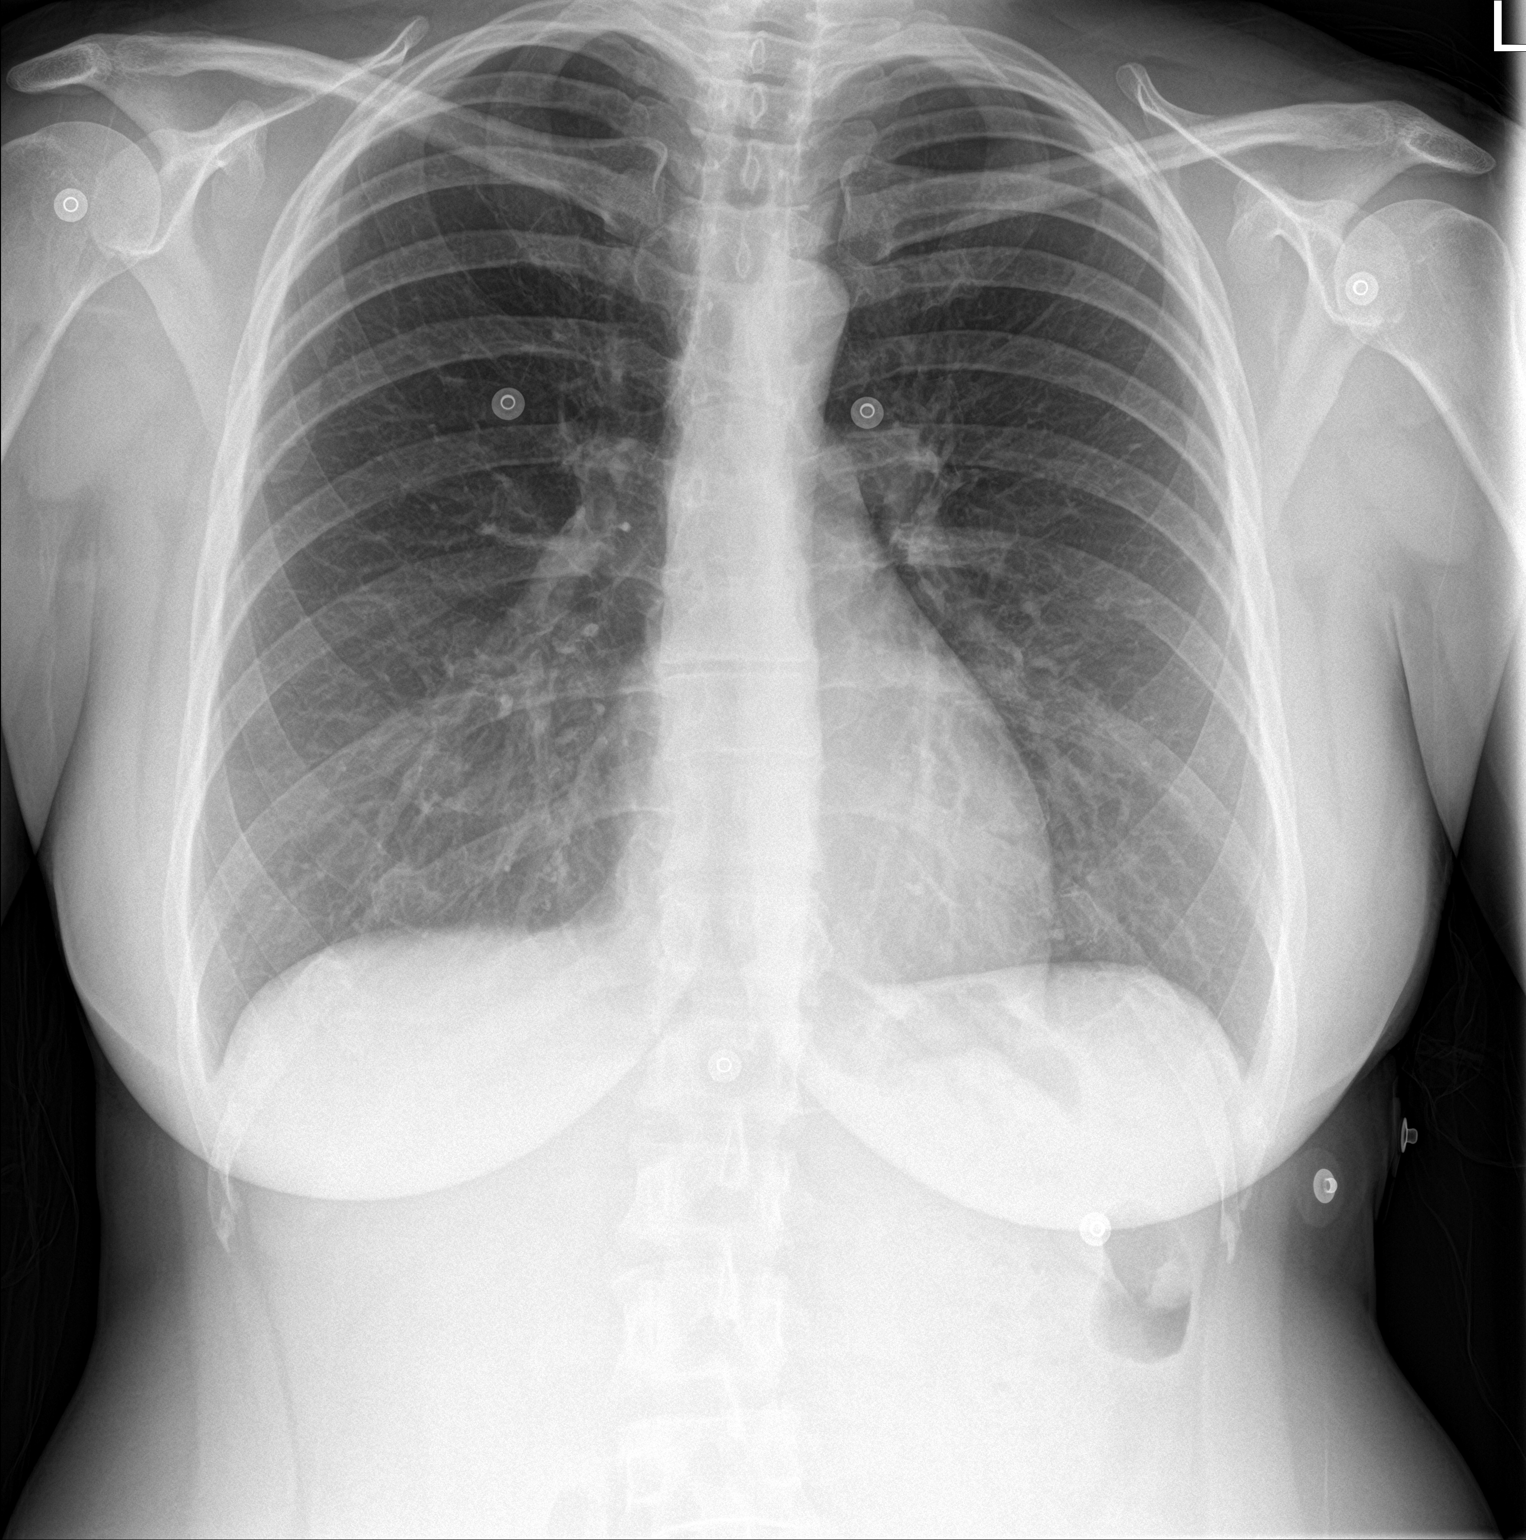

[chest lat]
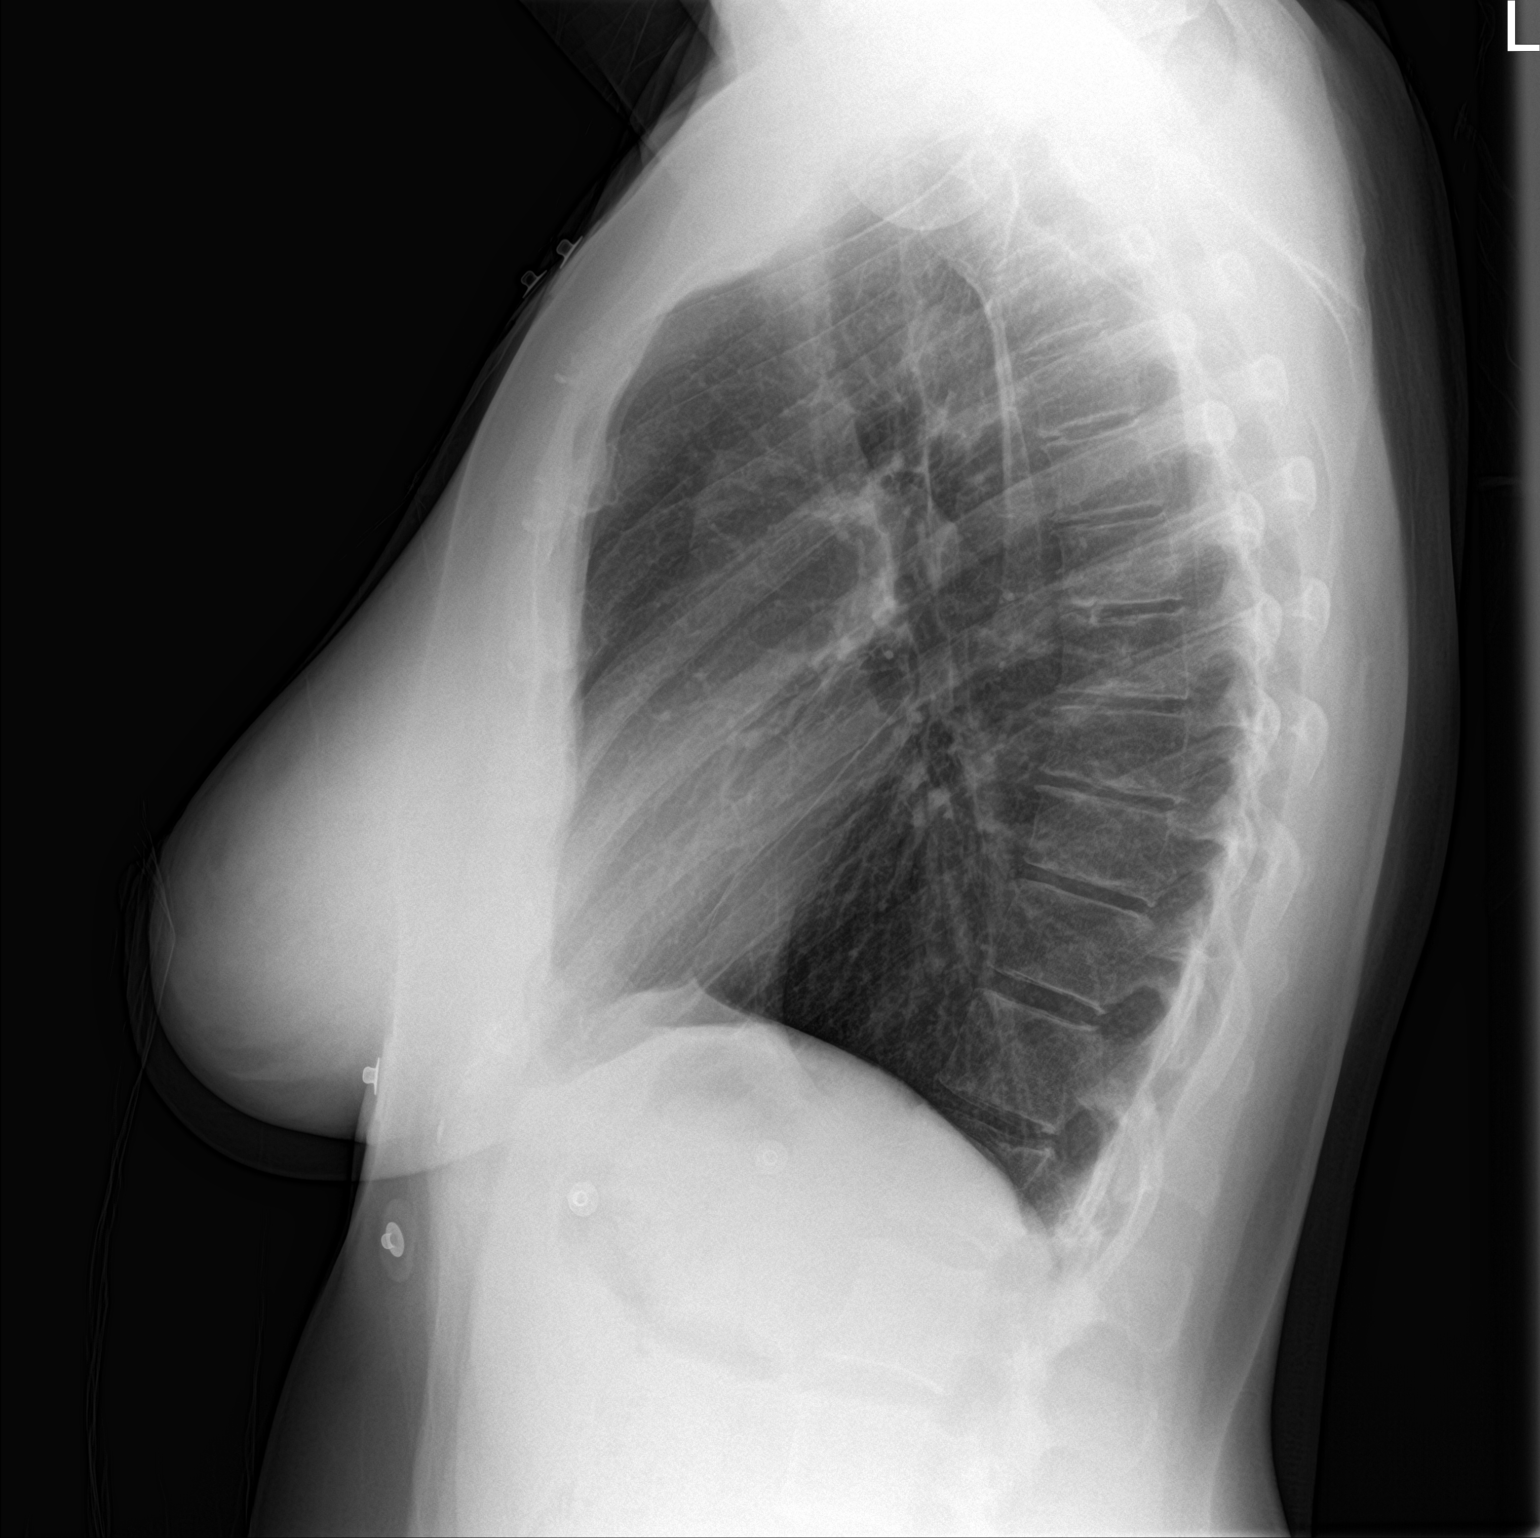

[2 of 2 positions shown; findings below may reference images not displayed]

FINDINGS: The heart size and mediastinal contours are within normal limits.
Both lungs are clear. The visualized skeletal structures are
unremarkable.
IMPRESSION: No active cardiopulmonary disease.

## 2021-11-11 ENCOUNTER — Other Ambulatory Visit: Payer: Self-pay | Admitting: Endocrinology

## 2021-11-11 DIAGNOSIS — E063 Autoimmune thyroiditis: Secondary | ICD-10-CM

## 2022-05-11 ENCOUNTER — Other Ambulatory Visit: Payer: Self-pay | Admitting: Obstetrics and Gynecology

## 2022-05-11 DIAGNOSIS — Z1231 Encounter for screening mammogram for malignant neoplasm of breast: Secondary | ICD-10-CM

## 2022-05-14 ENCOUNTER — Ambulatory Visit
Admission: RE | Admit: 2022-05-14 | Discharge: 2022-05-14 | Disposition: A | Discharge status: Home / Self Care | Payer: Managed Care, Other (non HMO) | Source: Ambulatory Visit | Attending: Obstetrics and Gynecology | Admitting: Obstetrics and Gynecology

## 2022-05-14 DIAGNOSIS — Z1231 Encounter for screening mammogram for malignant neoplasm of breast: Secondary | ICD-10-CM | POA: Diagnosis not present

## 2023-06-14 ENCOUNTER — Other Ambulatory Visit: Payer: Self-pay

## 2023-06-14 DIAGNOSIS — Z1231 Encounter for screening mammogram for malignant neoplasm of breast: Secondary | ICD-10-CM

## 2023-06-28 ENCOUNTER — Ambulatory Visit
Admission: RE | Admit: 2023-06-28 | Discharge: 2023-06-28 | Disposition: A | Discharge status: Home / Self Care | Source: Ambulatory Visit

## 2023-06-28 DIAGNOSIS — Z1231 Encounter for screening mammogram for malignant neoplasm of breast: Secondary | ICD-10-CM | POA: Insufficient documentation

## 2023-06-30 ENCOUNTER — Other Ambulatory Visit: Payer: Self-pay

## 2023-06-30 DIAGNOSIS — R928 Other abnormal and inconclusive findings on diagnostic imaging of breast: Secondary | ICD-10-CM

## 2023-07-08 ENCOUNTER — Ambulatory Visit
Admission: RE | Admit: 2023-07-08 | Discharge: 2023-07-08 | Disposition: A | Discharge status: Home / Self Care | Source: Ambulatory Visit

## 2023-07-08 DIAGNOSIS — R928 Other abnormal and inconclusive findings on diagnostic imaging of breast: Secondary | ICD-10-CM | POA: Diagnosis present

## 2023-12-06 ENCOUNTER — Other Ambulatory Visit: Payer: Self-pay

## 2023-12-06 DIAGNOSIS — N63 Unspecified lump in unspecified breast: Secondary | ICD-10-CM

## 2024-01-19 ENCOUNTER — Ambulatory Visit: Admission: RE | Admit: 2024-01-19 | Discharge: 2024-01-19 | Disposition: A | Source: Ambulatory Visit

## 2024-01-19 DIAGNOSIS — N63 Unspecified lump in unspecified breast: Secondary | ICD-10-CM | POA: Insufficient documentation
